# Patient Record
Sex: Male | Born: 1975 | Hispanic: Yes | Marital: Married | Smoking: Former smoker
Health system: Southern US, Community
[De-identification: ages and names within clinical notes are randomized; demographics above are authoritative.]

## PROBLEM LIST (undated history)

## (undated) DIAGNOSIS — E119 Type 2 diabetes mellitus without complications: Secondary | ICD-10-CM

## (undated) HISTORY — PX: LEG SURGERY: SHX1003

---

## 2014-02-01 ENCOUNTER — Emergency Department: Payer: Self-pay | Admitting: Emergency Medicine

## 2014-08-14 ENCOUNTER — Ambulatory Visit: Payer: Self-pay | Admitting: Physician Assistant

## 2014-08-14 LAB — URINALYSIS, COMPLETE
Bilirubin,UR: NEGATIVE
Blood: NEGATIVE
Ketone: 40
Leukocyte Esterase: NEGATIVE
Nitrite: NEGATIVE
PH: 5.5 (ref 5.0–8.0)
Protein: NEGATIVE
Specific Gravity: 1.025 (ref 1.000–1.030)

## 2014-08-14 LAB — COMPREHENSIVE METABOLIC PANEL
ALT: 28 U/L
ANION GAP: 9 (ref 7–16)
AST: 15 U/L (ref 15–37)
Albumin: 3.9 g/dL (ref 3.4–5.0)
Alkaline Phosphatase: 134 U/L — ABNORMAL HIGH
BUN: 17 mg/dL (ref 7–18)
Bilirubin,Total: 0.4 mg/dL (ref 0.2–1.0)
Calcium, Total: 9 mg/dL (ref 8.5–10.1)
Chloride: 99 mmol/L (ref 98–107)
Co2: 28 mmol/L (ref 21–32)
Creatinine: 0.92 mg/dL (ref 0.60–1.30)
EGFR (African American): >60
Glucose: 268 mg/dL — ABNORMAL HIGH (ref 65–99)
Osmolality: 283 (ref 275–301)
POTASSIUM: 3.7 mmol/L (ref 3.5–5.1)
SODIUM: 136 mmol/L (ref 136–145)
Total Protein: 7.6 g/dL (ref 6.4–8.2)

## 2014-08-16 LAB — URINE CULTURE

## 2015-01-05 ENCOUNTER — Ambulatory Visit: Payer: Self-pay | Admitting: Physician Assistant

## 2015-04-11 ENCOUNTER — Encounter (HOSPITAL_BASED_OUTPATIENT_CLINIC_OR_DEPARTMENT_OTHER): Payer: Self-pay | Admitting: *Deleted

## 2015-04-11 ENCOUNTER — Emergency Department (HOSPITAL_BASED_OUTPATIENT_CLINIC_OR_DEPARTMENT_OTHER): Payer: No Typology Code available for payment source

## 2015-04-11 ENCOUNTER — Emergency Department (HOSPITAL_BASED_OUTPATIENT_CLINIC_OR_DEPARTMENT_OTHER)
Admission: EM | Admit: 2015-04-11 | Discharge: 2015-04-11 | Disposition: A | Discharge status: Home / Self Care | Payer: No Typology Code available for payment source | Attending: Emergency Medicine | Admitting: Emergency Medicine

## 2015-04-11 DIAGNOSIS — S161XXA Strain of muscle, fascia and tendon at neck level, initial encounter: Secondary | ICD-10-CM | POA: Insufficient documentation

## 2015-04-11 DIAGNOSIS — M79605 Pain in left leg: Secondary | ICD-10-CM

## 2015-04-11 DIAGNOSIS — Y998 Other external cause status: Secondary | ICD-10-CM | POA: Insufficient documentation

## 2015-04-11 DIAGNOSIS — S39012A Strain of muscle, fascia and tendon of lower back, initial encounter: Secondary | ICD-10-CM | POA: Diagnosis not present

## 2015-04-11 DIAGNOSIS — Y9389 Activity, other specified: Secondary | ICD-10-CM | POA: Insufficient documentation

## 2015-04-11 DIAGNOSIS — S8992XA Unspecified injury of left lower leg, initial encounter: Secondary | ICD-10-CM | POA: Insufficient documentation

## 2015-04-11 DIAGNOSIS — Y92410 Unspecified street and highway as the place of occurrence of the external cause: Secondary | ICD-10-CM | POA: Insufficient documentation

## 2015-04-11 DIAGNOSIS — E119 Type 2 diabetes mellitus without complications: Secondary | ICD-10-CM | POA: Insufficient documentation

## 2015-04-11 DIAGNOSIS — S3992XA Unspecified injury of lower back, initial encounter: Secondary | ICD-10-CM | POA: Diagnosis present

## 2015-04-11 HISTORY — DX: Type 2 diabetes mellitus without complications: E11.9

## 2015-04-11 MED ORDER — HYDROCODONE-ACETAMINOPHEN 5-325 MG PO TABS: 1.0000 | ORAL_TABLET | ORAL | Status: DC | PRN

## 2015-04-11 MED ORDER — HYDROCODONE-ACETAMINOPHEN 5-325 MG PO TABS
1.0000 | ORAL_TABLET | Freq: Once | ORAL | Status: AC
  Administered 2015-04-11: 1 via ORAL
  Filled 2015-04-11: qty 1

## 2015-04-11 MED ORDER — CYCLOBENZAPRINE HCL 5 MG PO TABS: 5.0000 mg | ORAL_TABLET | Freq: Two times a day (BID) | ORAL | Status: DC | PRN

## 2015-04-11 NOTE — ED Notes (Signed)
Pt is able to add weight to his Rt leg, whoever, he is unable to add weight to his Lt leg. Pt states that his Lt calf is hurting and when he adds weight, the pain shots done to his Lt toes. Pt was unable to ambulate for me and RN Fannie KneeSue and NP Deliah BostonVrinda were informed.
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
## 2015-04-11 NOTE — Discharge Instructions (Signed)
Cervical Sprain °A cervical sprain is an injury in the neck in which the strong, fibrous tissues (ligaments) that connect your neck bones stretch or tear. Cervical sprains can range from mild to severe. Severe cervical sprains can cause the neck vertebrae to be unstable. This can lead to damage of the spinal cord and can result in serious nervous system problems. The amount of time it takes for a cervical sprain to get better depends on the cause and extent of the injury. Most cervical sprains heal in 1 to 3 weeks. °CAUSES  °Severe cervical sprains may be caused by:  °· Contact sport injuries (such as from football, rugby, wrestling, hockey, auto racing, gymnastics, diving, martial arts, or boxing).   °· Motor vehicle collisions.   °· Whiplash injuries. This is an injury from a sudden forward and backward whipping movement of the head and neck.  °· Falls.   °Mild cervical sprains may be caused by:  °· Being in an awkward position, such as while cradling a telephone between your ear and shoulder.   °· Sitting in a chair that does not offer proper support.   °· Working at a poorly designed computer station.   °· Looking up or down for long periods of time.   °SYMPTOMS  °· Pain, soreness, stiffness, or a burning sensation in the front, back, or sides of the neck. This discomfort may develop immediately after the injury or slowly, 24 hours or more after the injury.   °· Pain or tenderness directly in the middle of the back of the neck.   °· Shoulder or upper back pain.   °· Limited ability to move the neck.   °· Headache.   °· Dizziness.   °· Weakness, numbness, or tingling in the hands or arms.   °· Muscle spasms.   °· Difficulty swallowing or chewing.   °· Tenderness and swelling of the neck.   °DIAGNOSIS  °Most of the time your health care provider can diagnose a cervical sprain by taking your history and doing a physical exam. Your health care provider will ask about previous neck injuries and any known neck  problems, such as arthritis in the neck. X-rays may be taken to find out if there are any other problems, such as with the bones of the neck. Other tests, such as a CT scan or MRI, may also be needed.  °TREATMENT  °Treatment depends on the severity of the cervical sprain. Mild sprains can be treated with rest, keeping the neck in place (immobilization), and pain medicines. Severe cervical sprains are immediately immobilized. Further treatment is done to help with pain, muscle spasms, and other symptoms and may include: °· Medicines, such as pain relievers, numbing medicines, or muscle relaxants.   °· Physical therapy. This may involve stretching exercises, strengthening exercises, and posture training. Exercises and improved posture can help stabilize the neck, strengthen muscles, and help stop symptoms from returning.   °HOME CARE INSTRUCTIONS  °· Put ice on the injured area.   °¨ Put ice in a plastic bag.   °¨ Place a towel between your skin and the bag.   °¨ Leave the ice on for 15-20 minutes, 3-4 times a day.   °· If your injury was severe, you may have been given a cervical collar to wear. A cervical collar is a two-piece collar designed to keep your neck from moving while it heals. °¨ Do not remove the collar unless instructed by your health care provider. °¨ If you have long hair, keep it outside of the collar. °¨ Ask your health care provider before making any adjustments to your collar. Minor   adjustments may be required over time to improve comfort and reduce pressure on your chin or on the back of your head. °¨ If you are allowed to remove the collar for cleaning or bathing, follow your health care provider's instructions on how to do so safely. °¨ Keep your collar clean by wiping it with mild soap and water and drying it completely. If the collar you have been given includes removable pads, remove them every 1-2 days and hand wash them with soap and water. Allow them to air dry. They should be completely  dry before you wear them in the collar. °¨ If you are allowed to remove the collar for cleaning and bathing, wash and dry the skin of your neck. Check your skin for irritation or sores. If you see any, tell your health care provider. °¨ Do not drive while wearing the collar.   °· Only take over-the-counter or prescription medicines for pain, discomfort, or fever as directed by your health care provider.   °· Keep all follow-up appointments as directed by your health care provider.   °· Keep all physical therapy appointments as directed by your health care provider.   °· Make any needed adjustments to your workstation to promote good posture.   °· Avoid positions and activities that make your symptoms worse.   °· Warm up and stretch before being active to help prevent problems.   °SEEK MEDICAL CARE IF:  °· Your pain is not controlled with medicine.   °· You are unable to decrease your pain medicine over time as planned.   °· Your activity level is not improving as expected.   °SEEK IMMEDIATE MEDICAL CARE IF:  °· You develop any bleeding. °· You develop stomach upset. °· You have signs of an allergic reaction to your medicine.   °· Your symptoms get worse.   °· You develop new, unexplained symptoms.   °· You have numbness, tingling, weakness, or paralysis in any part of your body.   °MAKE SURE YOU:  °· Understand these instructions. °· Will watch your condition. °· Will get help right away if you are not doing well or get worse. °Document Released: 08/23/2007 Document Revised: 10/31/2013 Document Reviewed: 05/03/2013 °Exit[employer]® Patient Information ©2015 [employer]. This information is not intended to replace advice given to you by your health care provider. Make sure you discuss any questions you have with your health care provider. °Motor Vehicle Collision °After a car crash (motor vehicle collision), it is normal to have bruises and sore muscles. The first 24 hours usually feel the worst. After that, you will  likely start to feel better each day. °HOME CARE °· Put ice on the injured area. °¨ Put ice in a plastic bag. °¨ Place a towel between your skin and the bag. °¨ Leave the ice on for 15-20 minutes, 03-04 times a day. °· Drink enough fluids to keep your pee (urine) clear or pale yellow. °· Do not drink alcohol. °· Take a warm shower or bath 1 or 2 times a day. This helps your sore muscles. °· Return to activities as told by your doctor. Be careful when lifting. Lifting can make neck or back pain worse. °· Only take medicine as told by your doctor. Do not use aspirin. °GET HELP RIGHT AWAY IF:  °· Your arms or legs tingle, feel weak, or lose feeling (numbness). °· You have headaches that do not get better with medicine. °· You have neck pain, especially in the middle of the back of your neck. °· You cannot control when you pee (urinate) or   poop (bowel movement). °· Pain is getting worse in any part of your body. °· You are short of breath, dizzy, or pass out (faint). °· You have chest pain. °· You feel sick to your stomach (nauseous), throw up (vomit), or sweat. °· You have belly (abdominal) pain that gets worse. °· There is blood in your pee, poop, or throw up. °· You have pain in your shoulder (shoulder strap areas). °· Your problems are getting worse. °MAKE SURE YOU:  °· Understand these instructions. °· Will watch your condition. °· Will get help right away if you are not doing well or get worse. °Document Released: 04/13/2008 Document Revised: 01/18/2012 Document Reviewed: 03/25/2011 °ExitCare® Patient Information ©2015 [employer]. This information is not intended to replace advice given to you by your health care provider. Make sure you discuss any questions you have with your health care provider. ° °
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
## 2015-04-15 ENCOUNTER — Emergency Department
Admission: EM | Admit: 2015-04-15 | Discharge: 2015-04-15 | Disposition: A | Discharge status: Home / Self Care | Payer: No Typology Code available for payment source | Attending: Emergency Medicine | Admitting: Emergency Medicine

## 2015-04-15 ENCOUNTER — Emergency Department: Payer: No Typology Code available for payment source

## 2015-04-15 ENCOUNTER — Encounter: Payer: Self-pay | Admitting: Emergency Medicine

## 2015-04-15 DIAGNOSIS — S0990XD Unspecified injury of head, subsequent encounter: Secondary | ICD-10-CM

## 2015-04-15 DIAGNOSIS — Z79899 Other long term (current) drug therapy: Secondary | ICD-10-CM | POA: Insufficient documentation

## 2015-04-15 DIAGNOSIS — Z87891 Personal history of nicotine dependence: Secondary | ICD-10-CM | POA: Diagnosis not present

## 2015-04-15 DIAGNOSIS — E119 Type 2 diabetes mellitus without complications: Secondary | ICD-10-CM | POA: Diagnosis not present

## 2015-04-15 DIAGNOSIS — S8012XD Contusion of left lower leg, subsequent encounter: Secondary | ICD-10-CM | POA: Diagnosis not present

## 2015-04-15 DIAGNOSIS — S8992XD Unspecified injury of left lower leg, subsequent encounter: Secondary | ICD-10-CM | POA: Diagnosis present

## 2015-04-15 MED ORDER — KETOROLAC TROMETHAMINE 60 MG/2ML IM SOLN
60.0000 mg | Freq: Once | INTRAMUSCULAR | Status: AC
  Administered 2015-04-15: 60 mg via INTRAMUSCULAR

## 2015-04-15 MED ORDER — KETOROLAC TROMETHAMINE 60 MG/2ML IM SOLN
INTRAMUSCULAR | Status: AC
Start: 2015-04-15 — End: 2015-04-15
  Filled 2015-04-15: qty 2

## 2015-04-15 MED ORDER — ORPHENADRINE CITRATE 30 MG/ML IJ SOLN
60.0000 mg | Freq: Two times a day (BID) | INTRAMUSCULAR | Status: DC
  Administered 2015-04-15: 60 mg via INTRAMUSCULAR

## 2015-04-15 MED ORDER — IBUPROFEN 800 MG PO TABS
800.0000 mg | ORAL_TABLET | Freq: Three times a day (TID) | ORAL | Status: DC | PRN
Start: 2015-04-15 — End: 2016-04-27

## 2015-04-15 MED ORDER — ORPHENADRINE CITRATE 30 MG/ML IJ SOLN
INTRAMUSCULAR | Status: AC
  Filled 2015-04-15: qty 2
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
## 2015-04-15 NOTE — ED Notes (Signed)
2 days ago, pain L leg and back

## 2015-04-15 NOTE — Discharge Instructions (Signed)
Take medication as prescribed. Alternate heat and ice. Rest. Avoid strenuous activity.  Follow up with your primary care physician this week.  Return to the ER for new or worsening concerns.  Contusion A contusion is a deep bruise. Contusions happen when an injury causes bleeding under the skin. Signs of bruising include pain, puffiness (swelling), and discolored skin. The contusion may turn blue, purple, or yellow. HOME CARE   Put ice on the injured area.  Put ice in a plastic bag.  Place a towel between your skin and the bag.  Leave the ice on for 15-20 minutes, 03-04 times a day.  Only take medicine as told by your doctor.  Rest the injured area.  If possible, raise (elevate) the injured area to lessen puffiness. GET HELP RIGHT AWAY IF:   You have more bruising or puffiness.  You have pain that is getting worse.  Your puffiness or pain is not helped by medicine. MAKE SURE YOU:   Understand these instructions.  Will watch your condition.  Will get help right away if you are not doing well or get worse. Document Released: 04/13/2008 Document Revised: 01/18/2012 Document Reviewed: 08/31/2011 [facility] Patient Information 2015 [place], [place]. This information is not intended to replace advice given to you by your health care provider. Make sure you discuss any questions you have with your health care provider.  Head Injury You have received a head injury. It does not appear serious at this time. Headaches and vomiting are common following head injury. It should be easy to awaken from sleeping. Sometimes it is necessary for you to stay in the emergency department for a while for observation. Sometimes admission to the hospital may be needed. After injuries such as yours, most problems occur within the first 24 hours, but side effects may occur up to 7-10 days after the injury. It is important for you to carefully monitor your condition and contact your health care provider or  seek immediate medical care if there is a change in your condition. WHAT ARE THE TYPES OF HEAD INJURIES? Head injuries can be as minor as a bump. Some head injuries can be more severe. More severe head injuries include:  A jarring injury to the brain (concussion).  A bruise of the brain (contusion). This mean there is bleeding in the brain that can cause swelling.  A cracked skull (skull fracture).  Bleeding in the brain that collects, clots, and forms a bump (hematoma). WHAT CAUSES A HEAD INJURY? A serious head injury is most likely to happen to someone who is in a car wreck and is not wearing a seat belt. Other causes of major head injuries include bicycle or motorcycle accidents, sports injuries, and falls. HOW ARE HEAD INJURIES DIAGNOSED? A complete history of the event leading to the injury and your current symptoms will be helpful in diagnosing head injuries. Many times, pictures of the brain, such as CT or MRI are needed to see the extent of the injury. Often, an overnight hospital stay is necessary for observation.  WHEN SHOULD I SEEK IMMEDIATE MEDICAL CARE?  You should get help right away if:  You have confusion or drowsiness.  You feel sick to your stomach (nauseous) or have continued, forceful vomiting.  You have dizziness or unsteadiness that is getting worse.  You have severe, continued headaches not relieved by medicine. Only take over-the-counter or prescription medicines for pain, fever, or discomfort as directed by your health care provider.  You do not have normal  function of the arms or legs or are unable to walk.  You notice changes in the black spots in the center of the colored part of your eye (pupil).  You have a clear or bloody fluid coming from your nose or ears.  You have a loss of vision. During the next 24 hours after the injury, you must stay with someone who can watch you for the warning signs. This person should contact local emergency services (911 in  the [place] if you have seizures, you become unconscious, or you are unable to wake up. HOW CAN I PREVENT A HEAD INJURY IN THE FUTURE? The most important factor for preventing major head injuries is avoiding motor vehicle accidents. To minimize the potential for damage to your head, it is crucial to wear seat belts while riding in motor vehicles. Wearing helmets while bike riding and playing collision sports (like football) is also helpful. Also, avoiding dangerous activities around the house will further help reduce your risk of head injury.  WHEN CAN I RETURN TO NORMAL ACTIVITIES AND ATHLETICS? You should be reevaluated by your health care provider before returning to these activities. If you have any of the following symptoms, you should not return to activities or contact sports until 1 week after the symptoms have stopped:  Persistent headache.  Dizziness or vertigo.  Poor attention and concentration.  Confusion.  Memory problems.  Nausea or vomiting.  Fatigue or tire easily.  Irritability.  Intolerant of bright lights or loud noises.  Anxiety or depression.  Disturbed sleep. MAKE SURE YOU:   Understand these instructions.  Will watch your condition.  Will get help right away if you are not doing well or get worse. Document Released: 10/26/2005 Document Revised: 10/31/2013 Document Reviewed: 07/03/2013 [facility] Patient Information 2015 [place], [place]. This information is not intended to replace advice given to you by your health care provider. Make sure you discuss any questions you have with your health care provider.
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
## 2015-07-10 ENCOUNTER — Emergency Department
Admission: EM | Admit: 2015-07-10 | Discharge: 2015-07-10 | Disposition: A | Discharge status: Home / Self Care | Payer: Self-pay | Attending: Emergency Medicine | Admitting: Emergency Medicine

## 2015-07-10 ENCOUNTER — Encounter: Payer: Self-pay | Admitting: Student

## 2015-07-10 DIAGNOSIS — E119 Type 2 diabetes mellitus without complications: Secondary | ICD-10-CM | POA: Insufficient documentation

## 2015-07-10 DIAGNOSIS — A084 Viral intestinal infection, unspecified: Secondary | ICD-10-CM | POA: Insufficient documentation

## 2015-07-10 DIAGNOSIS — Z87891 Personal history of nicotine dependence: Secondary | ICD-10-CM | POA: Insufficient documentation

## 2015-07-10 LAB — GLUCOSE, CAPILLARY: GLUCOSE-CAPILLARY: 129 mg/dL — AB (ref 65–99)

## 2015-07-10 MED ORDER — ONDANSETRON 4 MG PO TBDP
4.0000 mg | ORAL_TABLET | Freq: Once | ORAL | Status: AC
  Administered 2015-07-10: 4 mg via ORAL
  Filled 2015-07-10: qty 1

## 2015-07-10 MED ORDER — ONDANSETRON 4 MG PO TBDP: 4.0000 mg | ORAL_TABLET | Freq: Three times a day (TID) | ORAL | Status: DC | PRN

## 2015-07-10 NOTE — ED Notes (Signed)
Pt reports n/v/d since this morning. States he took his morning meds and has been sick ever since. Denies eating anything this morning. States he normally eats and then takes his meds but he did not feel like eating. Sates he "feels like he has a fever" and that he has felt dizzy. Pt alert & oriented with NAD noted.

## 2015-07-10 NOTE — ED Provider Notes (Signed)
[facility] Emergency Department Provider Note ____________________________________________  Time seen: Approximately 11:58 AM  I have reviewed the triage vital signs and the nursing notes.   HISTORY  Chief Complaint Nausea and Emesis   HPI Roberto Alesi Garza is a 39 y.o. male who presents to the emergency department for evaluation of nausea and diarrhea. He states that he took his medications on an empty stomach, which she does not typically do. He states that he just did not have an appetite this morning and felt a little dizzy and weak. He reports 2 episodes of vomiting and 3 episodes of diarrhea today. He complains of feeling hot and cold. He denies abdominal pain.   Past Medical History  Diagnosis Date  . Diabetes mellitus without complication     There are no active problems to display for this patient.   Past Surgical History  Procedure Laterality Date  . Leg surgery      Current Outpatient Rx  Name  Route  Sig  Dispense  Refill  . cyclobenzaprine (FLEXERIL) 5 MG tablet   Oral   Take 1 tablet (5 mg total) by mouth 2 (two) times daily as needed.   20 tablet   0   . HYDROcodone-acetaminophen (NORCO/VICODIN) 5-325 MG per tablet   Oral   Take 1-2 tablets by mouth every 4 (four) hours as needed.   10 tablet   0   . ibuprofen (ADVIL,MOTRIN) 800 MG tablet   Oral   Take 1 tablet (800 mg total) by mouth every 8 (eight) hours as needed for mild pain or moderate pain.   15 tablet   0   . METFORMIN HCL PO   Oral   Take by mouth.         . ondansetron (ZOFRAN-ODT) 4 MG disintegrating tablet   Oral   Take 1 tablet (4 mg total) by mouth every 8 (eight) hours as needed for nausea or vomiting.   20 tablet   0     Allergies Review of patient's allergies indicates no known allergies.  History reviewed. No pertinent family history.  Social History Social History  Substance Use Topics  . Smoking status: Former Games developer  . Smokeless  tobacco: None  . Alcohol Use: Yes     Comment: occasional    Review of Systems Constitutional: No fever/ positive forchills Eyes: No visual changes. ENT: No sore throat. Cardiovascular: Denies chest pain. Respiratory: Denies shortness of breath. Gastrointestinal: No abdominal pain.  Positive for nausea, positive for vomiting.  Positive for diarrhea.  No constipation. Genitourinary: Negative for dysuria. Musculoskeletal: Negative for back pain. Skin: Negative for rash. Neurological: Negative for headaches, focal weakness or numbness.  10-point ROS otherwise negative.  ____________________________________________   PHYSICAL EXAM:  VITAL SIGNS: ED Triage Vitals  Enc Vitals Group     BP 07/10/15 1039 128/94 mmHg     Pulse Rate 07/10/15 1039 85     Resp 07/10/15 1039 18     Temp 07/10/15 1039 98.1 F (36.7 C)     Temp Source 07/10/15 1039 Oral     SpO2 07/10/15 1039 98 %     Weight 07/10/15 1039 230 lb (104.327 kg)     Height 07/10/15 1039 5\' 7"  (1.702 m)     Head Cir --      Peak Flow --      Pain Score 07/10/15 1040 3     Pain Loc --      Pain Edu? --  Excl. in GC? --     Constitutional: Alert and oriented. Well appearing and in no acute distress. Eyes: Conjunctivae are normal. PERRL. EOMI. Head: Atraumatic. Nose: No congestion/rhinnorhea. Mouth/Throat: Mucous membranes are moist.  Oropharynx non-erythematous. Neck: No stridor.   Cardiovascular: Normal rate, regular rhythm. Grossly normal heart sounds.  Good peripheral circulation. Respiratory: Normal respiratory effort.  No retractions. Lungs CTAB. Gastrointestinal: Soft and nontender. No distention. No abdominal bruits. No CVA tenderness. Musculoskeletal: No lower extremity tenderness nor edema.  No joint effusions. Neurologic:  Normal speech and language. No gross focal neurologic deficits are appreciated. No gait instability. Skin:  Skin is warm, dry and intact. No rash noted. Psychiatric: Mood and affect  are normal. Speech and behavior are normal.  ____________________________________________   LABS (all labs ordered are listed, but only abnormal results are displayed)  Labs Reviewed  GLUCOSE, CAPILLARY - Abnormal; Notable for the following:    Glucose-Capillary 129 (*)    All other components within normal limits   ____________________________________________  EKG   ____________________________________________  RADIOLOGY  Not indicated ____________________________________________   PROCEDURES  Procedure(s) performed: None  Critical Care performed: No  ____________________________________________   INITIAL IMPRESSION / ASSESSMENT AND PLAN / ED COURSE  Pertinent labs & imaging results that were available during my care of the patient were reviewed by me and considered in my medical decision making (see chart for details).  Patient was given sublingual Zofran while in the emergency department with relief of nausea. He will be prescribed the same and discharged home. He's to follow-up with his primary care provider or return to emergency department for symptoms change or worsen. ____________________________________________   FINAL CLINICAL IMPRESSION(S) / ED DIAGNOSES  Final diagnoses:  Viral gastroenteritis      Chinita Pester, FNP 07/10/15 1251  Arnaldo Natal, MD 07/10/15 [phone]

## 2015-07-10 NOTE — ED Notes (Signed)
Pt states began to feel nauseated at work this morning, reports vomiting a small amount and saw specks of blood in the emesis. States he felt dizzy and "feverish".  Pt reports he is a diabetic and takes oral medications.

## 2015-07-10 NOTE — ED Notes (Signed)
rx given to pt. Pt understands discharge instructions

## 2016-04-06 ENCOUNTER — Ambulatory Visit
Admission: EM | Admit: 2016-04-06 | Discharge: 2016-04-06 | Disposition: A | Discharge status: Home / Self Care | Payer: BLUE CROSS/BLUE SHIELD | Attending: Family Medicine | Admitting: Family Medicine

## 2016-04-06 ENCOUNTER — Encounter: Payer: Self-pay | Admitting: Emergency Medicine

## 2016-04-06 DIAGNOSIS — M7712 Lateral epicondylitis, left elbow: Secondary | ICD-10-CM | POA: Diagnosis not present

## 2016-04-06 MED ORDER — HYDROCODONE-ACETAMINOPHEN 5-325 MG PO TABS: 1.0000 | ORAL_TABLET | Freq: Four times a day (QID) | ORAL | Status: DC | PRN

## 2016-04-06 NOTE — ED Notes (Signed)
Pt reports left arm pain for about a week, lower arm. Pt had injury to left arm about a week ago, hit it hard against a metal bar on scaffold. 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 Concerned for fracture or other injury.

## 2016-04-06 NOTE — Discharge Instructions (Signed)
Tennis Elbow  Tennis elbow (lateral epicondylitis) is inflammation of the outer tendons of your forearm close to your elbow. Your tendons attach your muscles to your bones. The outer tendons of your forearm are used to extend your wrist, and they attach on the outside part of your elbow. Tennis elbow is often found in people who play tennis, but anyone may get the condition from repeatedly extending the wrist or turning the forearm.  CAUSES  This condition is caused by repeatedly extending your wrist and using your hands. It can result from sports or work that requires repetitive forearm movements. Tennis elbow may also be caused by an injury.  RISK FACTORS  You have a higher risk of developing tennis elbow if you play tennis or another racquet sport. You also have a higher risk if you frequently use your hands for work. This condition is also more likely to develop in:  · Musicians.  · Carpenters, painters, and plumbers.  · Cooks.  · Cashiers.  · People who work in factories.  · Construction workers.  · Butchers.  · People who use computers.  SYMPTOMS  Symptoms of this condition include:  · Pain and tenderness in your forearm and the outer part of your elbow. You may only feel the pain when you use your arm, or you may feel it even when you are not using your arm.  · A burning feeling that runs from your elbow through your arm.  · Weak grip in your hands.  DIAGNOSIS   This condition may be diagnosed by medical history and physical exam. You may also have other tests, including:  · X-rays.  · MRI.  TREATMENT  Your health care provider will recommend lifestyle adjustments, such as resting and icing your arm. Treatment may also include:  · Medicines for inflammation. This may include shots of cortisone if your pain continues.  · Physical therapy. This may include massage or exercises.  · An elbow brace.  Surgery may eventually be recommended if your pain does not go away with treatment.  HOME CARE  INSTRUCTIONS  Activity  · Rest your elbow and wrist as directed by your health care provider. Try to avoid any activities that caused the problem until your health care provider says that you can do them again.  · If a physical therapist teaches you exercises, do all of them as directed.  · If you lift an object, lift it with your palm facing upward. This lowers the stress on your elbow.  Lifestyle  · If your tennis elbow is caused by sports, check your equipment and make sure that:    You are using it correctly.    It is the best fit for you.  · If your tennis elbow is caused by work, take breaks frequently, if you are able. Talk with your manager about how to best perform tasks in a way that is safe.    If your tennis elbow is caused by computer use, talk with your manager about any changes that can be made to your work environment.  General Instructions  · If directed, apply ice to the painful area:    Put ice in a plastic bag.    Place a towel between your skin and the bag.    Leave the ice on for 20 minutes, 2-3 times per day.  · Take medicines only as directed by your health care provider.  · If you were given a brace, wear it as directed   by your health care provider.  · Keep all follow-up visits as directed by your health care provider. This is important.  SEEK MEDICAL CARE IF:  · Your pain does not get better with treatment.  · Your pain gets worse.  · You have numbness or weakness in your forearm, hand, or fingers.     This information is not intended to replace advice given to you by your health care provider. Make sure you discuss any questions you have with your health care provider.     Document Released: 10/26/2005 Document Revised: 03/12/2015 Document Reviewed: 10/22/2014  Elsevier Interactive Patient Education ©2016 [employer]      Lateral Epicondylitis With Rehab  Lateral epicondylitis involves inflammation and pain around the outer portion of the elbow. The pain is caused by inflammation of the  tendons in the forearm that bring back (extend) the wrist. Lateral epicondylitis is also called tennis elbow, because it is very common in tennis players. However, it may occur in any individual who extends the wrist repetitively. If lateral epicondylitis is left untreated, it may become a chronic problem.  SYMPTOMS   · Pain, tenderness, and inflammation on the outer (lateral) side of the elbow.  · Pain or weakness with gripping activities.  · Pain that increases with wrist-twisting motions (playing tennis, using a screwdriver, opening a door or a jar).  · Pain with lifting objects, including a coffee cup.  CAUSES   Lateral epicondylitis is caused by inflammation of the tendons that extend the wrist. Causes of injury may include:  · Repetitive stress and strain on the muscles and tendons that extend the wrist.  · Sudden change in activity level or intensity.  · Incorrect grip in racquet sports.  · Incorrect grip size of racquet (often too large).  · Incorrect hitting position or technique (usually backhand, leading with the elbow).  · Using a racket that is too heavy.  RISK INCREASES WITH:  · Sports or occupations that require repetitive and/or strenuous forearm and wrist movements (tennis, squash, racquetball, carpentry).  · Poor wrist and forearm strength and flexibility.  · Failure to warm up properly before activity.  · Resuming activity before healing, rehabilitation, and conditioning are complete.  PREVENTION   · Warm up and stretch properly before activity.  · Maintain physical fitness:    Strength, flexibility, and endurance.    Cardiovascular fitness.  · Wear and use properly fitted equipment.  · Learn and use proper technique and have a coach correct improper technique.  · Wear a tennis elbow (counterforce) brace.  PROGNOSIS   The course of this condition depends on the degree of the injury. If treated properly, acute cases (symptoms lasting less than 4 weeks) are often resolved in 2 to 6 weeks. Chronic  (longer lasting cases) often resolve in 3 to 6 months but may require physical therapy.  RELATED COMPLICATIONS   · Frequently recurring symptoms, resulting in a chronic problem. Properly treating the problem the first time decreases frequency of recurrence.  · Chronic inflammation, scarring tendon degeneration, and partial tendon tear, requiring surgery.  · Delayed healing or resolution of symptoms.  TREATMENT   Treatment first involves the use of ice and medicine to reduce pain and inflammation. Strengthening and stretching exercises may help reduce discomfort if performed regularly. These exercises may be performed at home if the condition is an acute injury. Chronic cases may require a referral to a physical therapist for evaluation and treatment. Your caregiver may advise a corticosteroid   injection to help reduce inflammation. Rarely, surgery is needed.  MEDICATION  · If pain medicine is needed, nonsteroidal anti-inflammatory medicines (aspirin and ibuprofen), or other minor pain relievers (acetaminophen), are often advised.  · Do not take pain medicine for 7 days before surgery.  · Prescription pain relievers may be given, if your caregiver thinks they are needed. Use only as directed and only as much as you need.  · Corticosteroid injections may be recommended. These injections should be reserved only for the most severe cases, because they can only be given a certain number of times.  HEAT AND COLD  · Cold treatment (icing) should be applied for 10 to 15 minutes every 2 to 3 hours for inflammation and pain, and immediately after activity that aggravates your symptoms. Use ice packs or an ice massage.  · Heat treatment may be used before performing stretching and strengthening activities prescribed by your caregiver, physical therapist, or athletic trainer. Use a heat pack or a warm water soak.  SEEK MEDICAL CARE IF:  Symptoms get worse or do not improve in 2 weeks, despite treatment.  EXERCISES   RANGE OF  MOTION (ROM) AND STRETCHING EXERCISES - Epicondylitis, Lateral (Tennis Elbow)  These exercises may help you when beginning to rehabilitate your injury. Your symptoms may go away with or without further involvement from your physician, physical therapist, or athletic trainer. While completing these exercises, remember:   · Restoring tissue flexibility helps normal motion to return to the joints. This allows healthier, less painful movement and activity.  · An effective stretch should be held for at least 30 seconds.  · A stretch should never be painful. You should only feel a gentle lengthening or release in the stretched tissue.  RANGE OF MOTION - Wrist Flexion, Active-Assisted  · Extend your right / left elbow with your fingers pointing down.*  · Gently pull the back of your hand towards you, until you feel a gentle stretch on the top of your forearm.  · Hold this position for __________ seconds.  Repeat __________ times. Complete this exercise __________ times per day.   *If directed by your physician, physical therapist or athletic trainer, complete this stretch with your elbow bent, rather than extended.  RANGE OF MOTION - Wrist Extension, Active-Assisted  · Extend your right / left elbow and turn your palm upwards.*  · Gently pull your palm and fingertips back, so your wrist extends and your fingers point more toward the ground.  · You should feel a gentle stretch on the inside of your forearm.  · Hold this position for __________ seconds.  Repeat __________ times. Complete this exercise __________ times per day.  *If directed by your physician, physical therapist or athletic trainer, complete this stretch with your elbow bent, rather than extended.  STRETCH - Wrist Flexion  · Place the back of your right / left hand on a tabletop, leaving your elbow slightly bent. Your fingers should point away from your body.  · Gently press the back of your hand down onto the table by straightening your elbow. You should  feel a stretch on the top of your forearm.  · Hold this position for __________ seconds.  Repeat __________ times. Complete this stretch __________ times per day.   STRETCH - Wrist Extension   · Place your right / left fingertips on a tabletop, leaving your elbow slightly bent. Your fingers should point backwards.  · Gently press your fingers and palm down onto the table by   straightening your elbow. You should feel a stretch on the inside of your forearm.  · Hold this position for __________ seconds.  Repeat __________ times. Complete this stretch __________ times per day.   STRENGTHENING EXERCISES - Epicondylitis, Lateral (Tennis Elbow)  These exercises may help you when beginning to rehabilitate your injury. They may resolve your symptoms with or without further involvement from your physician, physical therapist, or athletic trainer. While completing these exercises, remember:   · Muscles can gain both the endurance and the strength needed for everyday activities through controlled exercises.  · Complete these exercises as instructed by your physician, physical therapist or athletic trainer. Increase the resistance and repetitions only as guided.  · You may experience muscle soreness or fatigue, but the pain or discomfort you are trying to eliminate should never worsen during these exercises. If this pain does get worse, stop and make sure you are following the directions exactly. If the pain is still present after adjustments, discontinue the exercise until you can discuss the trouble with your caregiver.  STRENGTH - Wrist Flexors  · Sit with your right / left forearm palm-up and fully supported on a table or countertop. Your elbow should be resting below the height of your shoulder. Allow your wrist to extend over the edge of the surface.  · Loosely holding a __________ weight, or a piece of rubber exercise band or tubing, slowly curl your hand up toward your forearm.  · Hold this position for __________  seconds. Slowly lower the wrist back to the starting position in a controlled manner.  Repeat __________ times. Complete this exercise __________ times per day.   STRENGTH - Wrist Extensors  · Sit with your right / left forearm palm-down and fully supported on a table or countertop. Your elbow should be resting below the height of your shoulder. Allow your wrist to extend over the edge of the surface.  · Loosely holding a __________ weight, or a piece of rubber exercise band or tubing, slowly curl your hand up toward your forearm.  · Hold this position for __________ seconds. Slowly lower the wrist back to the starting position in a controlled manner.  Repeat __________ times. Complete this exercise __________ times per day.   STRENGTH - Ulnar Deviators  · Stand with a ____________________ weight in your right / left hand, or sit while holding a rubber exercise band or tubing, with your healthy arm supported on a table or countertop.  · Move your wrist, so that your pinkie travels toward your forearm and your thumb moves away from your forearm.  · Hold this position for __________ seconds and then slowly lower the wrist back to the starting position.  Repeat __________ times. Complete this exercise __________ times per day  STRENGTH - Radial Deviators  · Stand with a ____________________ weight in your right / left hand, or sit while holding a rubber exercise band or tubing, with your injured arm supported on a table or countertop.  · Raise your hand upward in front of you or pull up on the rubber tubing.  · Hold this position for __________ seconds and then slowly lower the wrist back to the starting position.  Repeat __________ times. Complete this exercise __________ times per day.  STRENGTH - Forearm Supinators   · Sit with your right / left forearm supported on a table, keeping your elbow below shoulder height. Rest your hand over the edge, palm down.  · Gently grip a hammer or a   soup ladle.  · Without moving  your elbow, slowly turn your palm and hand upward to a "thumbs-up" position.  · Hold this position for __________ seconds. Slowly return to the starting position.  Repeat __________ times. Complete this exercise __________ times per day.   STRENGTH - Forearm Pronators   · Sit with your right / left forearm supported on a table, keeping your elbow below shoulder height. Rest your hand over the edge, palm up.  · Gently grip a hammer or a soup ladle.  · Without moving your elbow, slowly turn your palm and hand upward to a "thumbs-up" position.  · Hold this position for __________ seconds. Slowly return to the starting position.  Repeat __________ times. Complete this exercise __________ times per day.   STRENGTH - Grip  · Grasp a tennis ball, a dense sponge, or a large, rolled sock in your hand.  · Squeeze as hard as you can, without increasing any pain.  · Hold this position for __________ seconds. Release your grip slowly.  Repeat __________ times. Complete this exercise __________ times per day.   STRENGTH - Elbow Extensors, Isometric  · Stand or sit upright, on a firm surface. Place your right / left arm so that your palm faces your stomach, and it is at the height of your waist.  · Place your opposite hand on the underside of your forearm. Gently push up as your right / left arm resists. Push as hard as you can with both arms, without causing any pain or movement at your right / left elbow. Hold this stationary position for __________ seconds.  Gradually release the tension in both arms. Allow your muscles to relax completely before repeating.     This information is not intended to replace advice given to you by your health care provider. Make sure you discuss any questions you have with your health care provider.     Document Released: 10/26/2005 Document Revised: 11/16/2014 Document Reviewed: 02/07/2009  Elsevier Interactive Patient Education ©2016 [employer]

## 2016-04-27 ENCOUNTER — Emergency Department
Admission: EM | Admit: 2016-04-27 | Discharge: 2016-04-27 | Disposition: A | Discharge status: Home / Self Care | Payer: BLUE CROSS/BLUE SHIELD | Attending: Emergency Medicine | Admitting: Emergency Medicine

## 2016-04-27 ENCOUNTER — Emergency Department: Payer: BLUE CROSS/BLUE SHIELD

## 2016-04-27 DIAGNOSIS — Z87891 Personal history of nicotine dependence: Secondary | ICD-10-CM | POA: Insufficient documentation

## 2016-04-27 DIAGNOSIS — R05 Cough: Secondary | ICD-10-CM | POA: Diagnosis present

## 2016-04-27 DIAGNOSIS — Z7984 Long term (current) use of oral hypoglycemic drugs: Secondary | ICD-10-CM | POA: Insufficient documentation

## 2016-04-27 DIAGNOSIS — E119 Type 2 diabetes mellitus without complications: Secondary | ICD-10-CM | POA: Insufficient documentation

## 2016-04-27 DIAGNOSIS — J069 Acute upper respiratory infection, unspecified: Secondary | ICD-10-CM | POA: Diagnosis not present

## 2016-04-27 MED ORDER — PSEUDOEPH-BROMPHEN-DM 30-2-10 MG/5ML PO SYRP: 10.0000 mL | ORAL_SOLUTION | Freq: Four times a day (QID) | ORAL | Status: DC | PRN

## 2016-04-27 MED ORDER — FLUTICASONE PROPIONATE 50 MCG/ACT NA SUSP: 2.0000 | Freq: Every day | NASAL | Status: DC

## 2016-04-27 NOTE — ED Provider Notes (Signed)
[facility] Emergency Department Provider Note  ____________________________________________  Time seen: Approximately 7:52 AM  I have reviewed the triage vital signs and the nursing notes.   HISTORY  Chief Complaint Cough    HPI Roberto Garza is a 40 y.o. male , NAD, presents to the emergency department with 1 week history of cough and chest congestion. States he had cold began approximately a week and half ago. Felt that it was getting better but has had increasing cough and mild chest congestion over the last few days. Has taken over-the-counter cough and cold remedies off and on through this time. Denies fever, chills, body aches. Has not had any sinus pressure, ear pain, nasal congestion. Has had a mild scratchy sore throat. Denies chest pain, back pain, abdominal pain, nausea, vomiting, diarrhea. States that his sugars have been running normal at home and he has been taking his diabetic medications daily as prescribed.   Past Medical History  Diagnosis Date  . Diabetes mellitus without complication (HCC)     There are no active problems to display for this patient.   Past Surgical History  Procedure Laterality Date  . Leg surgery      Current Outpatient Rx  Name  Route  Sig  Dispense  Refill  . brompheniramine-pseudoephedrine-DM 30-2-10 MG/5ML syrup   Oral   Take 10 mLs by mouth 4 (four) times daily as needed.   200 mL   0   . fluticasone (FLONASE) 50 MCG/ACT nasal spray   Each Nare   Place 2 sprays into both nostrils daily.   16 g   0   . METFORMIN HCL PO   Oral   Take by mouth.           Allergies Review of patient's allergies indicates no known allergies.  No family history on file.  Social History Social History  Substance Use Topics  . Smoking status: Former Games developer  . Smokeless tobacco: None  . Alcohol Use: Yes     Comment: occasional     Review of Systems  Constitutional: No fever/chills Eyes: No visual  changes. No discharge ENT: Positive scratchy sore throat. No sinus pressure, ear pain, nasal congestion, runny nose, sneezing. Cardiovascular: No chest pain. Respiratory: Positive chest congestion, cough. No shortness of breath. No wheezing.  Gastrointestinal: No abdominal pain.  No nausea, vomiting.  No diarrhea.   Musculoskeletal: Negative for back pain, general myalgias.  Skin: Negative for rash. Neurological: Negative for headaches, focal weakness or numbness. 10-point ROS otherwise negative.  ____________________________________________   PHYSICAL EXAM:  VITAL SIGNS: ED Triage Vitals  Enc Vitals Group     BP 04/27/16 0745 132/83 mmHg     Pulse Rate 04/27/16 0745 66     Resp 04/27/16 0745 18     Temp 04/27/16 0745 98.2 F (36.8 C)     Temp Source 04/27/16 0745 Oral     SpO2 04/27/16 0745 97 %     Weight 04/27/16 0745 224 lb (101.606 kg)     Height 04/27/16 0745  (1.676 m)     Head Cir --      Peak Flow --      Pain Score 04/27/16 0747 0     Pain Loc --      Pain Edu? --      Excl. in GC? --      Constitutional: Alert and oriented. Well appearing and in no acute distress. Eyes: Conjunctivae are normal. PERRL. EOMI without pain.  Head: Atraumatic. ENT:      Ears: TMs visualized bilaterally without erythema, effusion, bulging, perforation.      Nose: No congestion/rhinnorhea.      Mouth/Throat: Mucous membranes are moist. Pharynx without erythema, swelling, exudate. Uvula is midline. Clear postnasal drip and mild cobblestoning noted. Neck: Supple with full range of motion. Hematological/Lymphatic/Immunilogical: No cervical lymphadenopathy. Cardiovascular: Normal rate, regular rhythm. Normal S1 and S2.  Good peripheral circulation. Respiratory: Normal respiratory effort without tachypnea or retractions. Lungs CTAB with breath sounds noted in all lung fields. Neurologic:  Normal speech and language. No gross focal neurologic deficits are appreciated.  Skin:  Skin  is warm, dry and intact. No rash noted. Psychiatric: Mood and affect are normal. Speech and behavior are normal. Patient exhibits appropriate insight and judgement.   ____________________________________________   LABS  None ____________________________________________  EKG  None ____________________________________________  RADIOLOGY I have personally viewed and evaluated these images (plain radiographs) as part of my medical decision making, as well as reviewing the written report by the radiologist.  Dg Chest 2 View  04/27/2016  CLINICAL DATA:  Dry cough for 4 days, dizziness and weakness. Diabetes. EXAM: CHEST  2 VIEW COMPARISON:  None available FINDINGS: The heart size and mediastinal contours are within normal limits. Both lungs are clear. The visualized skeletal structures are unremarkable. IMPRESSION: No active cardiopulmonary disease. Electronically Signed   By: Judie PetitM.  Shick M.D.   On: 04/27/2016 08:08    ____________________________________________    PROCEDURES  Procedure(s) performed: None    Medications - No data to display   ____________________________________________   INITIAL IMPRESSION / ASSESSMENT AND PLAN / ED COURSE  Pertinent imaging results that were available during my care of the patient were reviewed by me and considered in my medical decision making (see chart for details).  Patient's diagnosis is consistent with viral upper respiratory infection. Patient will be discharged home with prescriptions for Bromfed-DM and Flonase to take as directed. Patient given a work note for today. Patient is to follow up with his primary care provider if symptoms persist past this treatment course. Patient is given ED precautions to return to the ED for any worsening or new symptoms.      ____________________________________________  FINAL CLINICAL IMPRESSION(S) / ED DIAGNOSES  Final diagnoses:  Viral upper respiratory infection      NEW MEDICATIONS  STARTED DURING THIS VISIT:  Discharge Medication List as of 04/27/2016  8:15 AM    START taking these medications   Details  brompheniramine-pseudoephedrine-DM 30-2-10 MG/5ML syrup Take 10 mLs by mouth 4 (four) times daily as needed., Starting 04/27/2016, Until Discontinued, Print    fluticasone (FLONASE) 50 MCG/ACT nasal spray Place 2 sprays into both nostrils daily., Starting 04/27/2016, Until Discontinued, Print             Hope PigeonJami L Hagler, PA-C 04/27/16 [id number]  Myrna Blazeravid Matthew Schaevitz, MD 04/27/16 1538

## 2016-04-27 NOTE — Discharge Instructions (Signed)
Upper Respiratory Infection, Adult Most upper respiratory infections (URIs) are a viral infection of the air passages leading to the lungs. A URI affects the nose, throat, and upper air passages. The most common type of URI is nasopharyngitis and is typically referred to as "the common cold." URIs run their course and usually go away on their own. Most of the time, a URI does not require medical attention, but sometimes a bacterial infection in the upper airways can follow a viral infection. This is called a secondary infection. Sinus and middle ear infections are common types of secondary upper respiratory infections. Bacterial pneumonia can also complicate a URI. A URI can worsen asthma and chronic obstructive pulmonary disease (COPD). Sometimes, these complications can require emergency medical care and may be life threatening.  CAUSES Almost all URIs are caused by viruses. A virus is a type of germ and can spread from one person to another.  RISKS FACTORS You may be at risk for a URI if:   You smoke.   You have chronic heart or lung disease.  You have a weakened defense (immune) system.   You are very young or very old.   You have nasal allergies or asthma.  You work in crowded or poorly ventilated areas.  You work in health care facilities or schools. SIGNS AND SYMPTOMS  Symptoms typically develop 2-3 days after you come in contact with a cold virus. Most viral URIs last 7-10 days. However, viral URIs from the influenza virus (flu virus) can last 14-18 days and are typically more severe. Symptoms may include:   Runny or stuffy (congested) nose.   Sneezing.   Cough.   Sore throat.   Headache.   Fatigue.   Fever.   Loss of appetite.   Pain in your forehead, behind your eyes, and over your cheekbones (sinus pain).  Muscle aches.  DIAGNOSIS  Your health care provider may diagnose a URI by:  Physical exam.  Tests to check that your symptoms are not due to  another condition such as:  Strep throat.  Sinusitis.  Pneumonia.  Asthma. TREATMENT  A URI goes away on its own with time. It cannot be cured with medicines, but medicines may be prescribed or recommended to relieve symptoms. Medicines may help:  Reduce your fever.  Reduce your cough.  Relieve nasal congestion. HOME CARE INSTRUCTIONS   Take medicines only as directed by your health care provider.   Gargle warm saltwater or take cough drops to comfort your throat as directed by your health care provider.  Use a warm mist humidifier or inhale steam from a shower to increase air moisture. This may make it easier to breathe.  Drink enough fluid to keep your urine clear or pale yellow.   Eat soups and other clear broths and maintain good nutrition.   Rest as needed.   Return to work when your temperature has returned to normal or as your health care provider advises. You may need to stay home longer to avoid infecting others. You can also use a face mask and careful hand washing to prevent spread of the virus.  Increase the usage of your inhaler if you have asthma.   Do not use any tobacco products, including cigarettes, chewing tobacco, or electronic cigarettes. If you need help quitting, ask your health care provider. PREVENTION  The best way to protect yourself from getting a cold is to practice good hygiene.   Avoid oral or hand contact with people with cold   symptoms.   Wash your hands often if contact occurs.  There is no clear evidence that vitamin C, vitamin E, echinacea, or exercise reduces the chance of developing a cold. However, it is always recommended to get plenty of rest, exercise, and practice good nutrition.  SEEK MEDICAL CARE IF:   You are getting worse rather than better.   Your symptoms are not controlled by medicine.   You have chills.  You have worsening shortness of breath.  You have brown or red mucus.  You have yellow or brown nasal  discharge.  You have pain in your face, especially when you bend forward.  You have a fever.  You have swollen neck glands.  You have pain while swallowing.  You have white areas in the back of your throat. SEEK IMMEDIATE MEDICAL CARE IF:   You have severe or persistent:  Headache.  Ear pain.  Sinus pain.  Chest pain.  You have chronic lung disease and any of the following:  Wheezing.  Prolonged cough.  Coughing up blood.  A change in your usual mucus.  You have a stiff neck.  You have changes in your:  Vision.  Hearing.  Thinking.  Mood. MAKE SURE YOU:   Understand these instructions.  Will watch your condition.  Will get help right away if you are not doing well or get worse.   This information is not intended to replace advice given to you by your health care provider. Make sure you discuss any questions you have with your health care provider.   Document Released: 04/21/2001 Document Revised: 03/12/2015 Document Reviewed: 01/31/2014 Elsevier Interactive Patient Education 2016 [employer]  

## 2016-04-27 NOTE — ED Notes (Signed)
Presents with cough for several days  Afebrile on arrival  Able to speak full sentences cough is non prod at present

## 2016-04-27 NOTE — ED Notes (Signed)
Pt c/o cough with congestion for the past 9-10 days.

## 2016-05-27 NOTE — ED Provider Notes (Signed)
CSN: [id number]     Arrival date & time 04/06/16  1711 History   First MD Initiated Contact with Patient 04/06/16 1912     Chief Complaint  Patient presents with  . Arm Pain   (Consider location/radiation/quality/duration/timing/severity/associated sxs/prior Treatment) HPI Comments: 40 yo male with a c/o left arm (near elbow) pain for the past week. Denies any redness, rash, numbness/tingling, swelling.   Patient is a 40 y.o. male presenting with arm pain. The history is provided by the patient.  Arm Pain    Past Medical History  Diagnosis Date  . Diabetes mellitus without complication [facility](HCC)    Past Surgical History  Procedure Laterality Date  . Leg surgery     History reviewed. No pertinent family history. Social History  Substance Use Topics  . Smoking status: Former Games developermoker  . Smokeless tobacco: None  . Alcohol Use: Yes     Comment: occasional    Review of Systems  Allergies  Review of patient's allergies indicates no known allergies.  Home Medications   Prior to Admission medications   Medication Sig Start Date End Date Taking? Authorizing Provider  brompheniramine-pseudoephedrine-DM 30-2-10 MG/5ML syrup Take 10 mLs by mouth 4 (four) times daily as needed. 04/27/16   Jami L Hagler, PA-C  fluticasone (FLONASE) 50 MCG/ACT nasal spray Place 2 sprays into both nostrils daily. 04/27/16   Jami L Hagler, PA-C  METFORMIN HCL PO Take by mouth.    Historical Provider, MD   Meds Ordered and Administered this Visit  Medications - No data to display  BP 120/77 mmHg  Pulse 83  Temp(Src) 97.9 F (36.6 C) (Oral)  Resp 18  Ht 5\' 7"  (1.702 m)  Wt 224 lb (101.606 kg)  BMI 35.08 kg/m2  SpO2 99% No data found.   Physical Exam  Constitutional: He appears well-developed and well-nourished. No distress.  Musculoskeletal:       Left elbow: He exhibits normal range of motion, no swelling, no effusion, no deformity and no laceration. Tenderness found. Lateral epicondyle  tenderness noted.  Skin: He is not diaphoretic.  Nursing note and vitals reviewed.   ED Course  Procedures (including critical care time)  Labs Review Labs Reviewed - No data to display  Imaging Review No results found.   Visual Acuity Review  Right Eye Distance:   Left Eye Distance:   Bilateral Distance:    Right Eye Near:   Left Eye Near:    Bilateral Near:         MDM   1. Lateral epicondylitis, left    Discharge Medication List as of 04/06/2016  7:27 PM     1.  diagnosis reviewed with patient 2. rx as per orders above; reviewed possible side effects, interactions, risks and benefits  3. Recommend supportive treatment with rest, ice  4. Follow-up prn if symptoms worsen or don't improve    Payton Mccallumrlando Damar Petit, MD 05/27/16 1912

## 2016-07-14 ENCOUNTER — Encounter: Payer: Self-pay | Admitting: Medical Oncology

## 2016-07-14 ENCOUNTER — Emergency Department
Admission: EM | Admit: 2016-07-14 | Discharge: 2016-07-14 | Disposition: A | Discharge status: Home / Self Care | Payer: BLUE CROSS/BLUE SHIELD | Attending: Emergency Medicine | Admitting: Emergency Medicine

## 2016-07-14 ENCOUNTER — Emergency Department: Payer: BLUE CROSS/BLUE SHIELD

## 2016-07-14 DIAGNOSIS — Y92832 Beach as the place of occurrence of the external cause: Secondary | ICD-10-CM | POA: Insufficient documentation

## 2016-07-14 DIAGNOSIS — S6991XA Unspecified injury of right wrist, hand and finger(s), initial encounter: Secondary | ICD-10-CM | POA: Diagnosis present

## 2016-07-14 DIAGNOSIS — Y999 Unspecified external cause status: Secondary | ICD-10-CM | POA: Diagnosis not present

## 2016-07-14 DIAGNOSIS — Z79899 Other long term (current) drug therapy: Secondary | ICD-10-CM | POA: Insufficient documentation

## 2016-07-14 DIAGNOSIS — E119 Type 2 diabetes mellitus without complications: Secondary | ICD-10-CM | POA: Insufficient documentation

## 2016-07-14 DIAGNOSIS — W19XXXA Unspecified fall, initial encounter: Secondary | ICD-10-CM | POA: Diagnosis not present

## 2016-07-14 DIAGNOSIS — S60221A Contusion of right hand, initial encounter: Secondary | ICD-10-CM

## 2016-07-14 DIAGNOSIS — Z7984 Long term (current) use of oral hypoglycemic drugs: Secondary | ICD-10-CM | POA: Insufficient documentation

## 2016-07-14 DIAGNOSIS — Z87891 Personal history of nicotine dependence: Secondary | ICD-10-CM | POA: Diagnosis not present

## 2016-07-14 DIAGNOSIS — Y939 Activity, unspecified: Secondary | ICD-10-CM | POA: Insufficient documentation

## 2016-07-14 MED ORDER — NAPROXEN 500 MG PO TABS: 500.0000 mg | ORAL_TABLET | Freq: Two times a day (BID) | ORAL | 0 refills | Status: DC

## 2016-07-14 NOTE — ED Notes (Signed)
Pt alert and oriented X4, active, cooperative, pt in NAD. RR even and unlabored, color WNL.  Pt informed to return if any life threatening symptoms occur.   
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
## 2019-05-27 ENCOUNTER — Encounter: Payer: Self-pay | Admitting: Emergency Medicine

## 2019-05-27 ENCOUNTER — Other Ambulatory Visit: Payer: Self-pay

## 2019-05-27 DIAGNOSIS — U071 COVID-19: Secondary | ICD-10-CM | POA: Insufficient documentation

## 2019-05-27 DIAGNOSIS — Z7984 Long term (current) use of oral hypoglycemic drugs: Secondary | ICD-10-CM | POA: Diagnosis not present

## 2019-05-27 DIAGNOSIS — E119 Type 2 diabetes mellitus without complications: Secondary | ICD-10-CM | POA: Diagnosis not present

## 2019-05-27 DIAGNOSIS — R430 Anosmia: Secondary | ICD-10-CM | POA: Diagnosis present

## 2019-05-27 DIAGNOSIS — R197 Diarrhea, unspecified: Secondary | ICD-10-CM | POA: Insufficient documentation

## 2019-05-27 DIAGNOSIS — R05 Cough: Secondary | ICD-10-CM | POA: Diagnosis not present

## 2019-05-27 DIAGNOSIS — R0602 Shortness of breath: Secondary | ICD-10-CM | POA: Diagnosis not present

## 2019-05-27 DIAGNOSIS — Z87891 Personal history of nicotine dependence: Secondary | ICD-10-CM | POA: Insufficient documentation

## 2019-05-27 NOTE — ED Triage Notes (Signed)
Pr says he had a cough 3-4 days ago that has now since stopped; diarrhea 4 days ago that lasted 2 days; some shortness of breath that continues today; yesterday he lost his sense of smell and that has not returned;

## 2019-05-27 NOTE — ED Notes (Signed)
Patient reports he has had fever, chills, diarrhea and today lost sense of smell.

## 2019-05-28 ENCOUNTER — Emergency Department
Admission: EM | Admit: 2019-05-28 | Discharge: 2019-05-28 | Disposition: A | Discharge status: Home / Self Care | Payer: BC Managed Care – PPO | Attending: Emergency Medicine | Admitting: Emergency Medicine

## 2019-05-28 ENCOUNTER — Emergency Department: Payer: BC Managed Care – PPO

## 2019-05-28 DIAGNOSIS — Z20822 Contact with and (suspected) exposure to covid-19: Secondary | ICD-10-CM

## 2019-05-28 LAB — CBC WITH DIFFERENTIAL/PLATELET
Abs Immature Granulocytes: 0.02 10*3/uL (ref 0.00–0.07)
Basophils Absolute: 0 10*3/uL (ref 0.0–0.1)
Basophils Relative: 0 %
Eosinophils Absolute: 0.1 10*3/uL (ref 0.0–0.5)
Eosinophils Relative: 1 %
HCT: 42.1 % (ref 39.0–52.0)
Hemoglobin: 14.3 g/dL (ref 13.0–17.0)
Immature Granulocytes: 0 %
Lymphocytes Relative: 50 %
Lymphs Abs: 3 10*3/uL (ref 0.7–4.0)
MCH: 30 pg (ref 26.0–34.0)
MCHC: 34 g/dL (ref 30.0–36.0)
MCV: 88.3 fL (ref 80.0–100.0)
Monocytes Absolute: 0.3 10*3/uL (ref 0.1–1.0)
Monocytes Relative: 6 %
Neutro Abs: 2.6 10*3/uL (ref 1.7–7.7)
Neutrophils Relative %: 43 %
Platelets: 207 10*3/uL (ref 150–400)
RBC: 4.77 MIL/uL (ref 4.22–5.81)
RDW: 12.4 % (ref 11.5–15.5)
WBC: 6 10*3/uL (ref 4.0–10.5)
nRBC: 0 % (ref 0.0–0.2)

## 2019-05-28 LAB — COMPREHENSIVE METABOLIC PANEL
ALT: 30 U/L (ref 0–44)
AST: 23 U/L (ref 15–41)
Albumin: 4 g/dL (ref 3.5–5.0)
Alkaline Phosphatase: 93 U/L (ref 38–126)
Anion gap: 8 (ref 5–15)
BUN: 15 mg/dL (ref 6–20)
CO2: 27 mmol/L (ref 22–32)
Calcium: 8.6 mg/dL — ABNORMAL LOW (ref 8.9–10.3)
Chloride: 103 mmol/L (ref 98–111)
Creatinine, Ser: 0.82 mg/dL (ref 0.61–1.24)
GFR calc Af Amer: >60 mL/min (ref >60)
GFR calc non Af Amer: >60 mL/min (ref >60)
Glucose, Bld: 182 mg/dL — ABNORMAL HIGH (ref 70–99)
Potassium: 4.2 mmol/L (ref 3.5–5.1)
Sodium: 138 mmol/L (ref 135–145)
Total Bilirubin: 0.5 mg/dL (ref 0.3–1.2)
Total Protein: 7 g/dL (ref 6.5–8.1)

## 2019-05-28 LAB — TROPONIN I (HIGH SENSITIVITY): Troponin I (High Sensitivity): 3 ng/L (ref <18)

## 2019-05-28 NOTE — ED Provider Notes (Signed)
[facility] Emergency Department Provider Note  ____________________________________________  Time seen: Approximately 1:06 AM  I have reviewed the triage vital signs and the nursing notes.   HISTORY  Chief Complaint loss of smell, Diarrhea, Shortness of Breath, and Cough   HPI Roberto Garza is a 43 y.o. male with a history of diabetes who presents for evaluation of COVID-like symptoms.  Patient has had 5 days of subjective fever, several daily episodes of diarrhea, cough, and body aches.  Today patient had loss of sense of smell.  Also started having mild shortness of breath which has been intermittent.  He denies any shortness of breath at this time.  He denies any chest pain.  He denies any known exposures to COVID.   Past Medical History:  Diagnosis Date   Diabetes mellitus without complication (HCC)     Past Surgical History:  Procedure Laterality Date   LEG SURGERY      Prior to Admission medications   Medication Sig Start Date End Date Taking? Authorizing Provider  glyBURIDE (DIABETA) 5 MG tablet Take 5 mg by mouth 2 (two) times daily with a meal.   Yes [provider]  METFORMIN HCL PO Take 1,000 mg by mouth 2 (two) times a day.    Yes [provider]    Allergies Patient has no known allergies.  History reviewed. No pertinent family history.  Social History Social History   Tobacco Use   Smoking status: Former Smoker   Smokeless tobacco: Never Used  Substance Use Topics   Alcohol use: Not Currently    Comment: occasional   Drug use: No    Review of Systems  Constitutional: + fever, body aches Eyes: Negative for visual changes. ENT: Negative for sore throat. Neck: No neck pain  Cardiovascular: Negative for chest pain. Respiratory: + shortness of breath, cough Gastrointestinal: Negative for abdominal pain, vomiting. + diarrhea. Genitourinary: Negative for dysuria. Musculoskeletal: Negative for  back pain. Skin: Negative for rash. Neurological: Negative for headaches, weakness or numbness. Psych: No SI or HI  ____________________________________________   PHYSICAL EXAM:  VITAL SIGNS: ED Triage Vitals  Enc Vitals Group     BP 05/27/19 2126 140/87     Pulse Rate 05/27/19 2126 66     Resp 05/27/19 2126 16     Temp 05/27/19 2126 98.7 F (37.1 C)     Temp Source 05/27/19 2117 Oral     SpO2 05/27/19 2126 99 %     Weight 05/27/19 2118 220 lb 0.3 oz (99.8 kg)     Height 05/27/19 2118 5\' 7"  (1.702 m)     Head Circumference --      Peak Flow --      Pain Score 05/27/19 2117 0     Pain Loc --      Pain Edu? --      Excl. in GC? --     Constitutional: Alert and oriented. Well appearing and in no apparent distress. HEENT:      Head: Normocephalic and atraumatic.         Eyes: Conjunctivae are normal. Sclera is non-icteric.       Mouth/Throat: Mucous membranes are moist.       Neck: Supple with no signs of meningismus. Cardiovascular: Regular rate and rhythm. No murmurs, gallops, or rubs. 2+ symmetrical distal pulses are present in all extremities. No JVD. Respiratory: Normal respiratory effort. Lungs are clear to auscultation bilaterally. No wheezes, crackles, or rhonchi.  Gastrointestinal: Soft,  non tender, and non distended with positive bowel sounds. No rebound or guarding. Musculoskeletal: Nontender with normal range of motion in all extremities. No edema, cyanosis, or erythema of extremities. Neurologic: Normal speech and language. Face is symmetric. Moving all extremities. No gross focal neurologic deficits are appreciated. Skin: Skin is warm, dry and intact. No rash noted. Psychiatric: Mood and affect are normal. Speech and behavior are normal.  ____________________________________________   LABS (all labs ordered are listed, but only abnormal results are displayed)  Labs Reviewed  COMPREHENSIVE METABOLIC PANEL - Abnormal; Notable for the following components:       Result Value   Glucose, Bld 182 (*)    Calcium 8.6 (*)    All other components within normal limits  NOVEL CORONAVIRUS, NAA (HOSPITAL ORDER, SEND-OUT TO REF LAB)  CBC WITH DIFFERENTIAL/PLATELET  TROPONIN I (HIGH SENSITIVITY)   ____________________________________________  EKG  ED ECG REPORT I, Rudene Re, the attending physician, personally viewed and interpreted this ECG.  Sinus bradycardia, rate of 55, normal intervals, normal axis, low voltage QRS, no ST elevations or depressions.  Otherwise normal EKG. ____________________________________________  RADIOLOGY  I have personally reviewed the images performed during this visit and I agree with the Radiologist's read.   Interpretation by Radiologist:  Dg Chest Portable 1 View  Result Date: 05/28/2019 CLINICAL DATA:  Fever, chills, loss sense of smell. COVID-19 like symptoms. EXAM: PORTABLE CHEST 1 VIEW COMPARISON:  04/27/2016 FINDINGS: Low lung volumes.The cardiomediastinal contours are normal. Pulmonary vasculature is normal. No consolidation, pleural effusion, or pneumothorax. No acute osseous abnormalities are seen. IMPRESSION: Low lung volumes without acute abnormality. Electronically Signed   By: Keith Rake M.D.   On: 05/28/2019 01:41      ____________________________________________   PROCEDURES  Procedure(s) performed: None Procedures Critical Care performed:  None ____________________________________________   INITIAL IMPRESSION / ASSESSMENT AND PLAN / ED COURSE  43 y.o. male with a history of diabetes who presents for evaluation of COVID-like symptoms x 5 days. Strong clinical suspicion for COVID.  Patient is extremely well-appearing and in no distress with normal vital signs, normal work of breathing, 100% sats both at rest and with ambulation. Labs, EKG, CXR with no acute findings. Covid swab sent. Discussed with patient that my clinical suspicion is high enough that even if COVID swab is negative I  still recommended quarantine (test used at [facility] has a reported false negative rate of 20%) until 7 days of no symptoms.  Also recommended 14-day quarantine for household members.  Recommended getting a pulse oximeter and checking his oxygenation 3 times daily and returning to the emergency room if results are 92% or less.  Discussed return precautions and follow-up with primary care doctor.     As part of my medical decision making, I reviewed the following data within the [place] notes reviewed and incorporated, Labs reviewed , EKG interpreted , Old chart reviewed, Radiograph reviewed , Notes from prior ED visits and [place] Controlled Substance Database    Patient was evaluated in Emergency Department today for the symptoms described in the history of present illness. Patient was evaluated in the context of the global COVID-19 pandemic, which necessitated consideration that the patient might be at risk for infection with the SARS-CoV-2 virus that causes COVID-19. Institutional protocols and algorithms that pertain to the evaluation of patients at risk for COVID-19 are in a state of rapid change based on information released by regulatory bodies including the CDC and federal and state organizations.  These policies and algorithms were followed during the patient's care in the ED.  ____________________________________________   FINAL CLINICAL IMPRESSION(S) / ED DIAGNOSES   Final diagnoses:  Suspected Covid-19 Virus Infection      NEW MEDICATIONS STARTED DURING THIS VISIT:  ED Discharge Orders    None       Note:  This document was prepared using Dragon voice recognition software and may include unintentional dictation errors.    Don PerkingVeronese, [place], MD 05/28/19 [phone]

## 2019-05-28 NOTE — Discharge Instructions (Addendum)
QUARANTINE INSTRUCTION  Follow these instructions at home:  Protecting others To avoid spreading the illness to other people: Quarantine in your home until you have had no cough and fever for 7 days. Household members should also be quarantine for at least 14 days after being exposed to you. Wash your hands often with soap and water. If soap and water are not available, use an alcohol-based hand sanitizer. If you have not cleaned your hands, do not touch your face. Make sure that all people in your household wash their hands well and often. Cover your nose and mouth when you cough or sneeze. Throw away used tissues. Stay home if you have any cold-like or flu-like symptoms. General instructions Go to your local pharmacy and buy a pulse oximeter (this is a machine that measures your oxygen). Check your oxygen levels at least 3 times a day. If your oxygen level is 92% or less return to the emergency room immediately Take over-the-counter and prescription medicines only as told by your health care provider. If you need medication for fever take tylenol or ibuprofen Drink enough fluid to keep your urine pale yellow. Rest at home as directed by your health care provider. Do not give aspirin to a child with the flu, because of the association with Reye's syndrome. Do not use tobacco products, including cigarettes, chewing tobacco, and e-cigarettes. If you need help quitting, ask your health care provider. Keep all follow-up visits as told by your health care provider. This is important. How is this prevented? Avoid areas where an outbreak has been reported. Avoid large groups of people. Keep a safe distance from people who are coughing and sneezing. Do not touch your face if you have not cleaned your hands. When you are around people who are sick or might be sick, wear a mask to protect yourself. Contact a health care provider if: You have symptoms of SARS (cough, fever, chest pain, shortness of  breath) that are not getting better at home. You have a fever. If you have difficulty breathing call 911 go to your local ER.

## 2019-05-28 NOTE — ED Notes (Signed)
Ambulatory pulse ox of 100%.

## 2019-05-28 NOTE — ED Notes (Signed)
Pt states 5 days of shob, fever, diarrhea and loss of smell. Pt ambulatory with strong cough but no resp distress noted.

## 2019-06-01 ENCOUNTER — Telehealth: Payer: Self-pay | Admitting: Emergency Medicine

## 2019-06-01 NOTE — Telephone Encounter (Signed)
Called patient and informed him of positive covid 19 test.  Explained cdc guidelines for coming out of isolation and 14 day isolation for any contacts.  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

## 2019-06-02 LAB — NOVEL CORONAVIRUS, NAA (HOSP ORDER, SEND-OUT TO REF LAB; TAT 18-24 HRS): SARS-CoV-2, NAA: DETECTED — AB

## 2020-02-04 ENCOUNTER — Ambulatory Visit: Payer: BC Managed Care – PPO | Attending: Internal Medicine

## 2020-02-04 DIAGNOSIS — Z23 Encounter for immunization: Secondary | ICD-10-CM

## 2020-02-04 NOTE — Progress Notes (Signed)
   Covid-19 Vaccination Clinic  Name:  Roberto Garza    MRN: [id number] DOB: Oct 06, 1976  02/04/2020  [name] was observed post Covid-19 immunization for 15 minutes without incident. He was provided with Vaccine Information Sheet and instruction to access the V-Safe system.   [name] was instructed to call 911 with any severe reactions post vaccine: Marland Kitchen Difficulty breathing  . Swelling of face and throat  . A fast heartbeat  . A bad rash all over body  . Dizziness and weakness   Immunizations Administered    Name Date Dose VIS Date Route   Pfizer COVID-19 Vaccine 02/04/2020  5:30 PM 0.3 mL 10/20/2019 Intramuscular   Manufacturer: [employer], [employer]   Lot: [id number]   NDC: [address]-9

## 2020-02-25 ENCOUNTER — Ambulatory Visit: Payer: BC Managed Care – PPO | Attending: Internal Medicine

## 2020-02-25 DIAGNOSIS — Z23 Encounter for immunization: Secondary | ICD-10-CM

## 2020-02-25 NOTE — Progress Notes (Signed)
   Covid-19 Vaccination Clinic  Name:  Roberto Garza    MRN: [id number] DOB: 1976/05/15  02/25/2020  Mr. Ice was observed post Covid-19 immunization for 15 minutes without incident. He was provided with Vaccine Information Sheet and instruction to access the V-Safe system.   [name] was instructed to call 911 with any severe reactions post vaccine: Marland Kitchen Difficulty breathing  . Swelling of face and throat  . A fast heartbeat  . A bad rash all over body  . Dizziness and weakness   Immunizations Administered    Name Date Dose VIS Date Route   Pfizer COVID-19 Vaccine 02/25/2020  5:23 PM 0.3 mL 10/20/2019 Intramuscular   Manufacturer: [employer], [employer]   Lot: [id number]   NDC: [address]-1

## 2020-08-05 ENCOUNTER — Emergency Department: Payer: PRIVATE HEALTH INSURANCE

## 2020-08-05 ENCOUNTER — Encounter: Payer: Self-pay | Admitting: *Deleted

## 2020-08-05 ENCOUNTER — Other Ambulatory Visit: Payer: Self-pay

## 2020-08-05 ENCOUNTER — Emergency Department
Admission: EM | Admit: 2020-08-05 | Discharge: 2020-08-05 | Disposition: A | Discharge status: Home / Self Care | Payer: PRIVATE HEALTH INSURANCE | Attending: Emergency Medicine | Admitting: Emergency Medicine

## 2020-08-05 DIAGNOSIS — S300XXA Contusion of lower back and pelvis, initial encounter: Secondary | ICD-10-CM | POA: Diagnosis not present

## 2020-08-05 DIAGNOSIS — Z7984 Long term (current) use of oral hypoglycemic drugs: Secondary | ICD-10-CM | POA: Diagnosis not present

## 2020-08-05 DIAGNOSIS — Y929 Unspecified place or not applicable: Secondary | ICD-10-CM | POA: Insufficient documentation

## 2020-08-05 DIAGNOSIS — Y990 Civilian activity done for income or pay: Secondary | ICD-10-CM | POA: Diagnosis not present

## 2020-08-05 DIAGNOSIS — E119 Type 2 diabetes mellitus without complications: Secondary | ICD-10-CM | POA: Insufficient documentation

## 2020-08-05 DIAGNOSIS — Z87891 Personal history of nicotine dependence: Secondary | ICD-10-CM | POA: Insufficient documentation

## 2020-08-05 DIAGNOSIS — W208XXA Other cause of strike by thrown, projected or falling object, initial encounter: Secondary | ICD-10-CM | POA: Diagnosis not present

## 2020-08-05 DIAGNOSIS — Y939 Activity, unspecified: Secondary | ICD-10-CM | POA: Insufficient documentation

## 2020-08-05 DIAGNOSIS — S3992XA Unspecified injury of lower back, initial encounter: Secondary | ICD-10-CM | POA: Diagnosis present

## 2020-08-05 MED ORDER — METHOCARBAMOL 500 MG PO TABS: 500.0000 mg | ORAL_TABLET | Freq: Four times a day (QID) | ORAL | 0 refills | Status: AC

## 2020-08-05 MED ORDER — OXYCODONE-ACETAMINOPHEN 5-325 MG PO TABS
1.0000 | ORAL_TABLET | Freq: Once | ORAL | Status: AC
  Administered 2020-08-05: 1 via ORAL
  Filled 2020-08-05: qty 1

## 2020-08-05 MED ORDER — MELOXICAM 15 MG PO TABS: 15.0000 mg | ORAL_TABLET | Freq: Every day | ORAL | 0 refills | Status: AC

## 2020-08-05 MED ORDER — KETOROLAC TROMETHAMINE 30 MG/ML IJ SOLN
30.0000 mg | Freq: Once | INTRAMUSCULAR | Status: AC
  Administered 2020-08-05: 30 mg via INTRAMUSCULAR
  Filled 2020-08-05: qty 1

## 2020-08-05 MED ORDER — ORPHENADRINE CITRATE 30 MG/ML IJ SOLN
60.0000 mg | Freq: Once | INTRAMUSCULAR | Status: AC
  Administered 2020-08-05: 60 mg via INTRAMUSCULAR
  Filled 2020-08-05: qty 2

## 2020-08-05 MED ORDER — OXYCODONE-ACETAMINOPHEN 5-325 MG PO TABS: 1.0000 | ORAL_TABLET | Freq: Four times a day (QID) | ORAL | 0 refills | Status: AC | PRN

## 2020-08-05 NOTE — ED Notes (Signed)
Patient preferred to remain in wheelchair due to pain. No abrasions noted on back. Patient c/o right lower lumbar pain.

## 2020-08-05 NOTE — ED Notes (Signed)
Attempted to call patient's supervisor Tim at [phone], but there was no answer. Employer is not on worker's comp list.

## 2020-08-05 NOTE — ED Triage Notes (Addendum)
Pt brought in via ems from home. Pt has lower back pain.  States injured back today at work. States a concrete block fell onto his back.  Pt alert speech clear.   States WC

## 2020-08-05 NOTE — ED Provider Notes (Signed)
[facility] Emergency Department Provider Note  ____________________________________________  Time seen: Approximately 10:25 PM  I have reviewed the triage vital signs and the nursing notes.   HISTORY  Chief Complaint Back Pain    HPI Cru Kritikos Heo is a 44 y.o. male who presents the emergency department for evaluation of back pain.  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  Patient states that he was able to get himself home, as he was trying to get into the house he had a sharp pain in his right lower back.  No radicular symptoms.  No bowel or bladder dysfunction, saddle anesthesia or paresthesias.  Patient states that the block landed on his         Past Medical History:  Diagnosis Date  . Diabetes mellitus without complication (HCC)     There are no problems to display for this patient.   Past Surgical History:  Procedure Laterality Date  . LEG SURGERY      Prior to Admission medications   Medication Sig Start Date End Date Taking? Authorizing Provider  glyBURIDE (DIABETA) 5 MG tablet Take 5 mg by mouth 2 (two) times daily with a meal.    [provider]  meloxicam (MOBIC) 15 MG tablet Take 1 tablet (15 mg total) by mouth daily. 08/05/20   Zlatan Hornback, Delorise Royals, PA-C  METFORMIN HCL PO Take 1,000 mg by mouth 2 (two) times a day.     [provider]  methocarbamol (ROBAXIN) 500 MG tablet Take 1 tablet (500 mg total) by mouth 4 (four) times daily. 08/05/20   Ananya Mccleese, Delorise Royals, PA-C  oxyCODONE-acetaminophen (PERCOCET/ROXICET) 5-325 MG tablet Take 1 tablet by mouth every 6 (six) hours as needed for severe pain. 08/05/20   Jazlynne Milliner, Delorise Royals, PA-C    Allergies Patient has no known allergies.  No family history on file.  Social History Social History   Tobacco Use  . Smoking  status: Former Games developer  . Smokeless tobacco: Never Used  Substance Use Topics  . Alcohol use: Not Currently    Comment: occasional  . Drug use: No     Review of Systems  Constitutional: No fever/chills Eyes: No visual changes. No discharge ENT: No upper respiratory complaints. Cardiovascular: no chest pain. Respiratory: no cough. No SOB. Gastrointestinal: No abdominal pain.  No nausea, no vomiting.  No diarrhea.  No constipation. Genitourinary: Negative for dysuria. No hematuria Musculoskeletal: Right lower back pain after a block of concrete fell in the lower back Skin: Negative for rash, abrasions, lacerations, ecchymosis. Neurological: Negative for headaches, focal weakness or numbness. 10-point ROS otherwise negative.  ____________________________________________   PHYSICAL EXAM:  VITAL SIGNS: ED Triage Vitals  Enc Vitals Group     BP 08/05/20 2036 (!) 169/96     Pulse Rate 08/05/20 2036 93     Resp 08/05/20 2036 20     Temp 08/05/20 2036 98.6 F (37 C)     Temp Source 08/05/20 2036 Oral     SpO2 08/05/20 2036 99 %     Weight 08/05/20 2037 200 lb (90.7 kg)     Height 08/05/20 2037 5\' 6"  (1.676 m)     Head Circumference --      Peak Flow --      Pain Score 08/05/20 2036 10     Pain Loc --  Pain Edu? --      Excl. in GC? --      Constitutional: Alert and oriented. Well appearing and in no acute distress. Eyes: Conjunctivae are normal. PERRL. EOMI. Head: Atraumatic. ENT:      Ears:       Nose: No congestion/rhinnorhea.      Mouth/Throat: Mucous membranes are moist.  Neck: No stridor.  No cervical spine tenderness to palpation.  Cardiovascular: Normal rate, regular rhythm. Normal S1 and S2.  Good peripheral circulation. Respiratory: Normal respiratory effort without tachypnea or retractions. Lungs CTAB. Good air entry to the bases with no decreased or absent breath sounds. Musculoskeletal: Full range of motion to all extremities. No gross deformities  appreciated.  Visualization of the lower back reveals no abrasions, lacerations, significant ecchymosis.  Patient with tenderness along the right lumbar spinal border, right paraspinal muscles, extending to the SI joint.  No palpable abnormality in this region.  No tenderness to palpation over the thoracic spine.  No tenderness along the iliac crest.  Dorsalis pedis pulses sensation intact bilateral lower extremities. Neurologic:  Normal speech and language. No gross focal neurologic deficits are appreciated.  Skin:  Skin is warm, dry and intact. No rash noted. Psychiatric: Mood and affect are normal. Speech and behavior are normal. Patient exhibits appropriate insight and judgement.   ____________________________________________   LABS (all labs ordered are listed, but only abnormal results are displayed)  Labs Reviewed - No data to display ____________________________________________  EKG   ____________________________________________  RADIOLOGY I personally viewed and evaluated these images as part of my medical decision making, as well as reviewing the written report by the radiologist.  DG Lumbar Spine 2-3 Views  Result Date: 08/05/2020 CLINICAL DATA:  cinder block fell on back, low back pain EXAM: LUMBAR SPINE - 2-3 VIEW COMPARISON:  None. FINDINGS: Straightening of normal lordosis. Mild broad-based rightward curvature. No acute fracture. The vertebral body heights are normal. The posterior elements are intact. Mild endplate spurring at L1-L2 and L5-S1 with preservation of disc spaces. The sacroiliac joints are congruent. IMPRESSION: 1. No acute fracture or subluxation of the lumbar spine. 2. Mild degenerative endplate spurring. Electronically Signed   By: Narda Rutherford M.D.   On: 08/05/2020 22:21    ____________________________________________    PROCEDURES  Procedure(s) performed:    Procedures    Medications  oxyCODONE-acetaminophen (PERCOCET/ROXICET) 5-325 MG per  tablet 1 tablet (has no administration in time range)  ketorolac (TORADOL) 30 MG/ML injection 30 mg (has no administration in time range)  orphenadrine (NORFLEX) injection 60 mg (has no administration in time range)     ____________________________________________   INITIAL IMPRESSION / ASSESSMENT AND PLAN / ED COURSE  Pertinent labs & imaging results that were available during my care of the patient were reviewed by me and considered in my medical decision making (see chart for details).  Review of the [place] CSRS was performed in accordance of the [facility] prior to dispensing any controlled drugs.           Patient's diagnosis is consistent with back contusion.  Patient presented to emergency department after a concrete block fell off of a scaffolding and struck the patient's right lower back.  Overall exam was reassuring.  No tenderness in to the abdominal wall.  Imaging reveals no acute fracture.  Patient is neurologically intact.  Patient will be prescribed symptom control medications.  Follow-up with primary care as needed. Patient will be discharged home with prescriptions for Roxicet, meloxicam, Robaxin.Patient is  given ED precautions to return to the ED for any worsening or new symptoms.     ____________________________________________  FINAL CLINICAL IMPRESSION(S) / ED DIAGNOSES  Final diagnoses:  Contusion of lower back, initial encounter      NEW MEDICATIONS STARTED DURING THIS VISIT:  ED Discharge Orders         Ordered    meloxicam (MOBIC) 15 MG tablet  Daily        08/05/20 2245    methocarbamol (ROBAXIN) 500 MG tablet  4 times daily        08/05/20 2245    oxyCODONE-acetaminophen (PERCOCET/ROXICET) 5-325 MG tablet  Every 6 hours PRN        08/05/20 2245              This chart was dictated using voice recognition software/Dragon. Despite best efforts to proofread, errors can occur which can change the meaning. Any change was purely unintentional.     Racheal Patches, PA-C 08/05/20 2248    Phineas Semen, MD 08/05/20 [phone]

## 2020-08-05 NOTE — ED Notes (Signed)
Pt alert and oriented X 4, stable for discharge. RR even and unlabored, color WNL. Discussed discharge instructions and follow-up as directed. Discharge medications discussed if provided. Pt had opportunity to ask questions if necessary and RN to provide patient/family eduction.  

## 2020-11-17 IMAGING — DX PORTABLE CHEST - 1 VIEW
1 series · 2 of 2 positions shown · non-contrast
Comparison: 04/27/2016

CLINICAL DATA: Fever, chills, loss sense of smell. [id number] like
symptoms.

EXAM:
PORTABLE CHEST 1 VIEW

[Series 1: chest ap · 0.14mm/px · 2 of 2 slices shown]
[im 1/2]
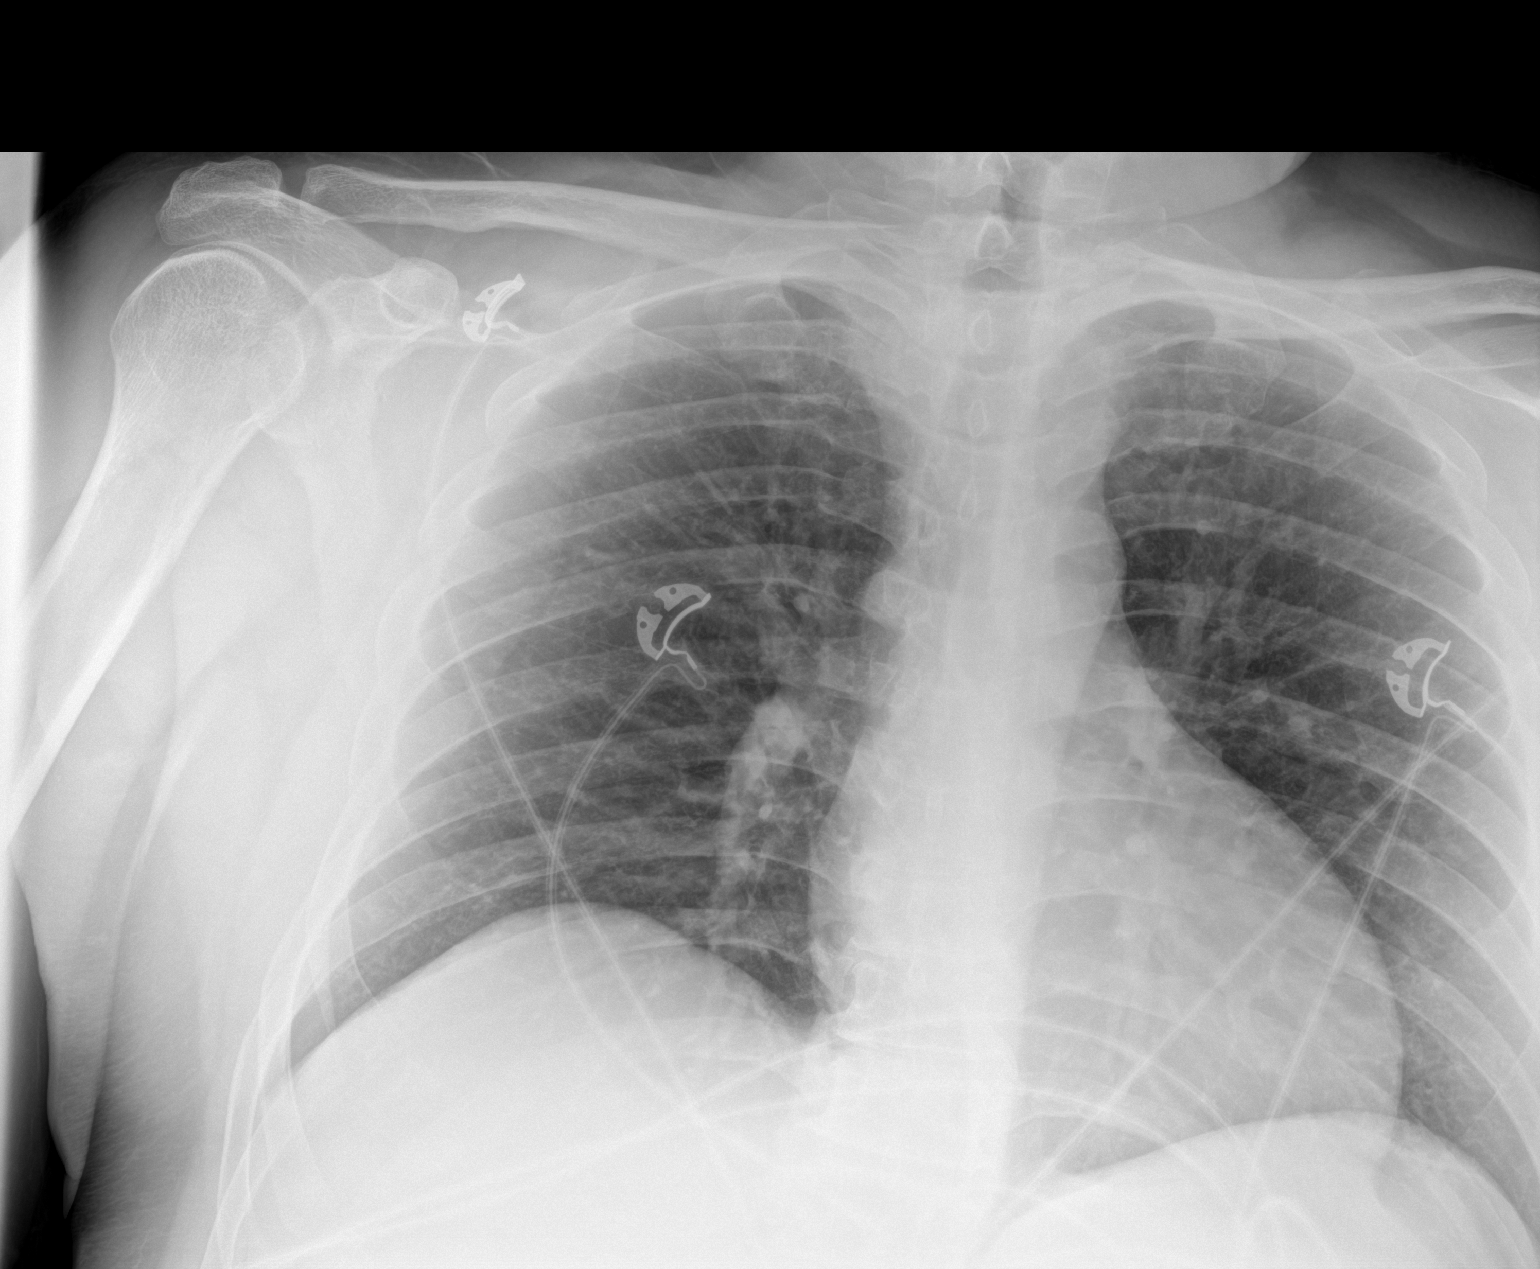
[im 2/2]
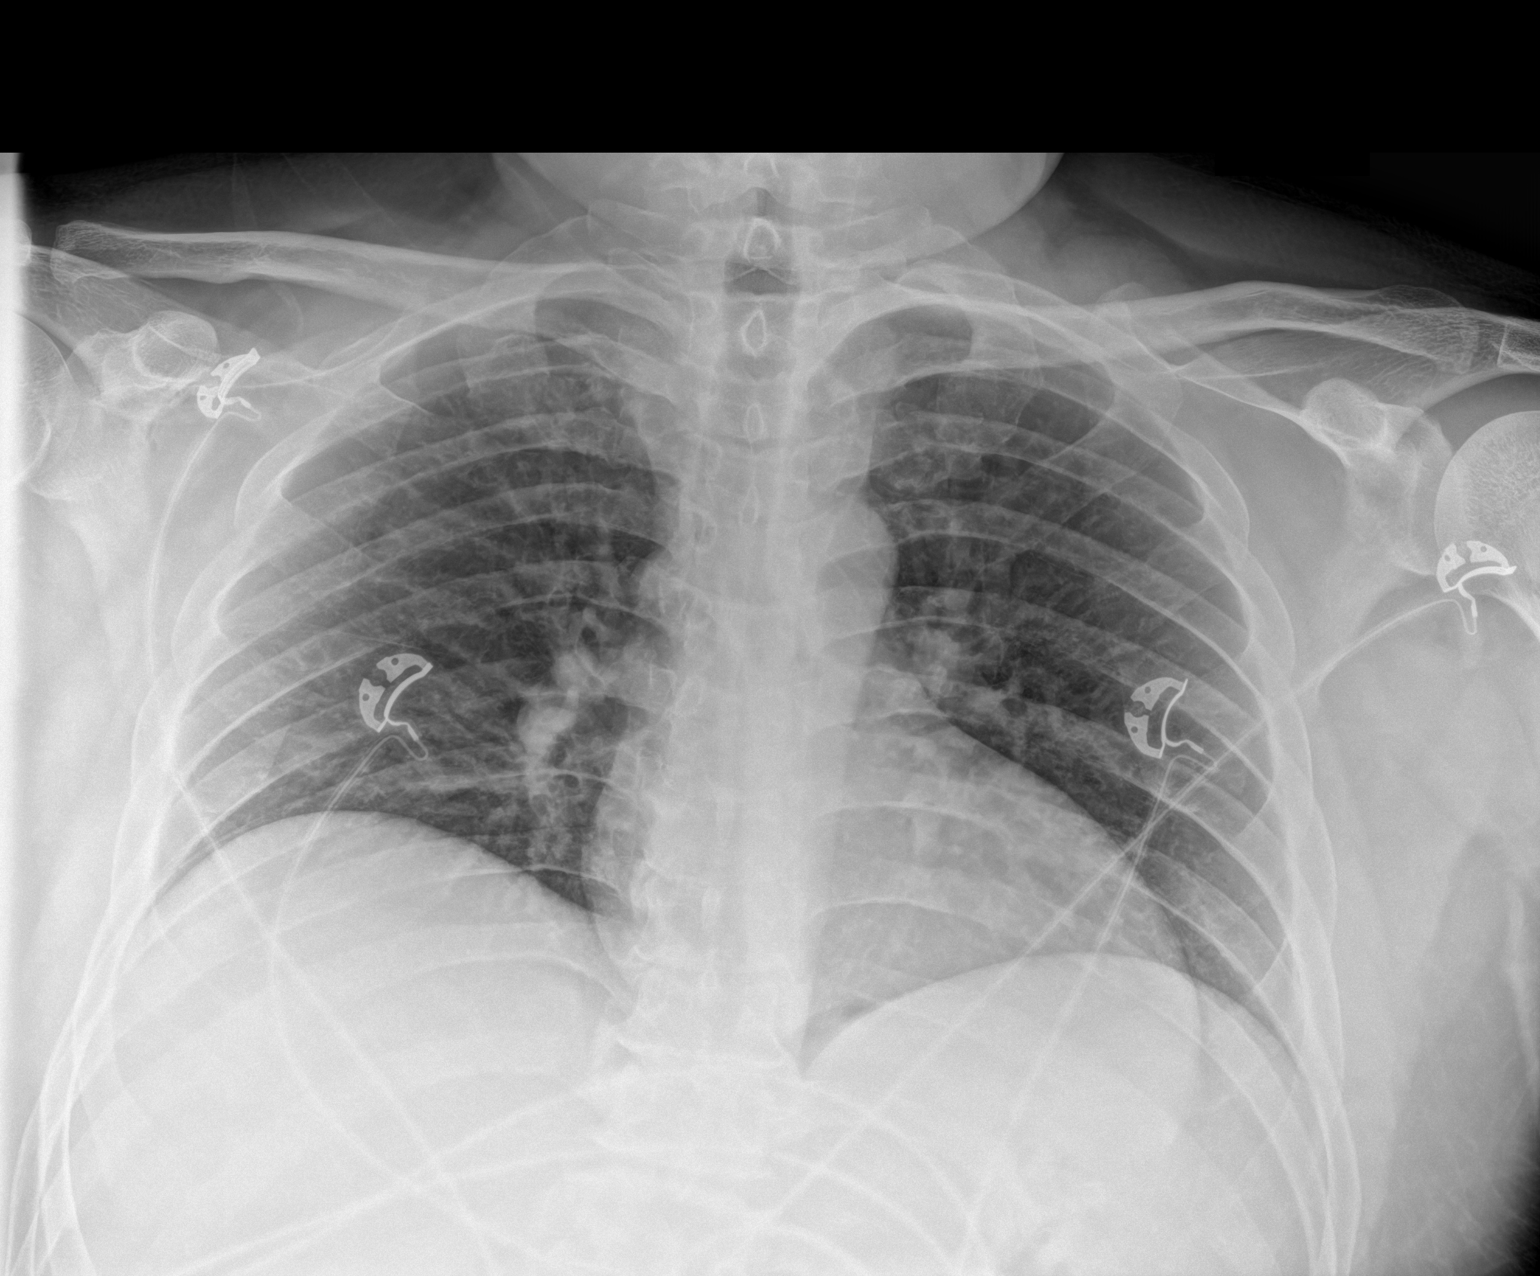

[2 of 2 positions shown; findings below may reference images not displayed]

FINDINGS: Low lung volumes.The cardiomediastinal contours are normal.
Pulmonary vasculature is normal. No consolidation, pleural effusion,
or pneumothorax. No acute osseous abnormalities are seen.
IMPRESSION: Low lung volumes without acute abnormality.

## 2021-06-15 ENCOUNTER — Other Ambulatory Visit: Payer: Self-pay

## 2021-06-15 ENCOUNTER — Ambulatory Visit (HOSPITAL_COMMUNITY)
Admission: EM | Admit: 2021-06-15 | Discharge: 2021-06-15 | Disposition: A | Discharge status: Home / Self Care | Payer: BC Managed Care – PPO | Attending: Physician Assistant | Admitting: Physician Assistant

## 2021-06-15 ENCOUNTER — Encounter (HOSPITAL_COMMUNITY): Payer: Self-pay | Admitting: *Deleted

## 2021-06-15 DIAGNOSIS — L309 Dermatitis, unspecified: Secondary | ICD-10-CM | POA: Diagnosis not present

## 2021-06-15 MED ORDER — TRIAMCINOLONE ACETONIDE 0.5 % EX OINT: 1.0000 "application " | TOPICAL_OINTMENT | Freq: Two times a day (BID) | CUTANEOUS | 0 refills | Status: AC

## 2021-06-15 NOTE — ED Triage Notes (Signed)
Pt reports a rash to Rt lower anterior leg after wearing tight socks.

## 2021-06-15 NOTE — ED Provider Notes (Signed)
[facility]    CSN: [id number] Arrival date & time: 06/15/21  1748      History   Chief Complaint Chief Complaint  Patient presents with   Rash    HPI Roberto Garza is a 45 y.o. male.   Pt reports he noticed itching.  Pt now has a red rash  The history is provided by the patient. No language interpreter was used.  Rash Location:  Leg Quality: itchiness and redness   Severity:  Moderate Onset quality:  Gradual Duration:  3 days Timing:  Constant Progression:  Worsening Relieved by:  Nothing Worsened by:  Nothing  Past Medical History:  Diagnosis Date   Diabetes mellitus without complication (HCC)     There are no problems to display for this patient.   Past Surgical History:  Procedure Laterality Date   LEG SURGERY         Home Medications    Prior to Admission medications   Medication Sig Start Date End Date Taking? Authorizing Provider  glyBURIDE (DIABETA) 5 MG tablet Take 5 mg by mouth 2 (two) times daily with a meal.   Yes [provider]  METFORMIN HCL PO Take 1,000 mg by mouth 2 (two) times a day.    Yes [provider]  triamcinolone ointment (KENALOG) 0.5 % Apply 1 application topically 2 (two) times daily. 06/15/21  Yes Elson Areas, PA-C  meloxicam (MOBIC) 15 MG tablet Take 1 tablet (15 mg total) by mouth daily. 08/05/20   Cuthriell, Delorise Royals, PA-C  methocarbamol (ROBAXIN) 500 MG tablet Take 1 tablet (500 mg total) by mouth 4 (four) times daily. 08/05/20   Cuthriell, Delorise Royals, PA-C  oxyCODONE-acetaminophen (PERCOCET/ROXICET) 5-325 MG tablet Take 1 tablet by mouth every 6 (six) hours as needed for severe pain. 08/05/20   Cuthriell, Delorise Royals, PA-C    Family History History reviewed. No pertinent family history.  Social History Social History   Tobacco Use   Smoking status: Former   Smokeless tobacco: Never  Substance Use Topics   Alcohol use: Not Currently    Comment: occasional   Drug use: No      Allergies   Patient has no known allergies.   Review of Systems Review of Systems  Skin:  Positive for rash.  All other systems reviewed and are negative.   Physical Exam Triage Vital Signs ED Triage Vitals [06/15/21 1828]  Enc Vitals Group     BP 125/79     Pulse Rate 80     Resp 16     Temp 98.2 F (36.8 C)     Temp src      SpO2 99 %     Weight      Height      Head Circumference      Peak Flow      Pain Score 0     Pain Loc      Pain Edu?      Excl. in GC?    No data found.  Updated Vital Signs BP 125/79   Pulse 80   Temp 98.2 F (36.8 C)   Resp 16   SpO2 99%   Visual Acuity Right Eye Distance:   Left Eye Distance:   Bilateral Distance:    Right Eye Near:   Left Eye Near:    Bilateral Near:     Physical Exam Constitutional:      Appearance: Normal appearance.  Musculoskeletal:  General: Normal range of motion.  Skin:    Findings: Erythema present.  Neurological:     General: No focal deficit present.     Mental Status: He is alert.  Psychiatric:        Mood and Affect: Mood normal.     UC Treatments / Results  Labs (all labs ordered are listed, but only abnormal results are displayed) Labs Reviewed - No data to display  EKG   Radiology No results found.  Procedures Procedures (including critical care time)  Medications Ordered in UC Medications - No data to display  Initial Impression / Assessment and Plan / UC Course  I have reviewed the triage vital signs and the nursing notes.  Pertinent labs & imaging results that were available during my care of the patient were reviewed by me and considered in my medical decision making (see chart for details).     MDM:  rash looks like contact.  Pt given rx for triamcinalone Final Clinical Impressions(s) / UC Diagnoses   Final diagnoses:  Dermatitis     Discharge Instructions      Recheck in 2 days if not improving   ED Prescriptions     Medication Sig  Dispense Auth. Provider   triamcinolone ointment (KENALOG) 0.5 % Apply 1 application topically 2 (two) times daily. 30 g Elson Areas, [place]      PDMP not reviewed this encounter. An After Visit Summary was printed and given to the patient.    Elson Areas, [place] 06/15/21 1849

## 2021-06-15 NOTE — Discharge Instructions (Addendum)
Recheck in 2 days if not improving °

## 2022-01-26 IMAGING — CR DG LUMBAR SPINE 2-3V
1 series · 3 of 3 positions shown · non-contrast
Comparison: None.

CLINICAL DATA: cinder block fell on back, low back pain

EXAM:
LUMBAR SPINE - 2-3 VIEW

[Series 1: dg lumbar spine 2-3 views · 0.14mm/px · 3 of 3 slices shown]
[im 1/3]
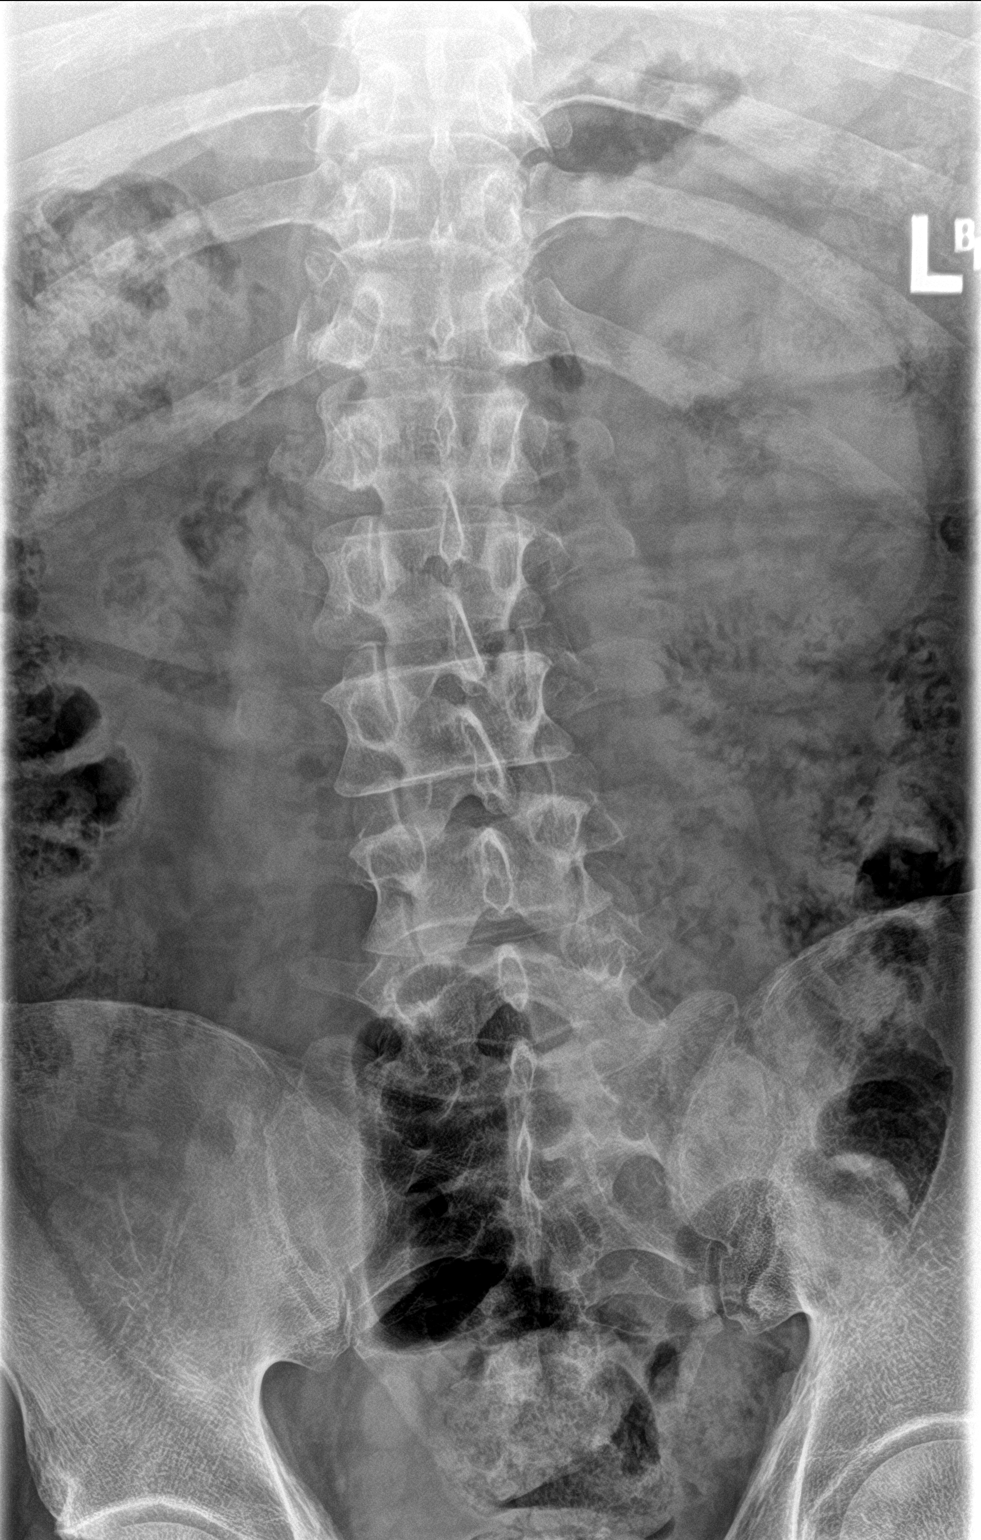
[im 2/3]
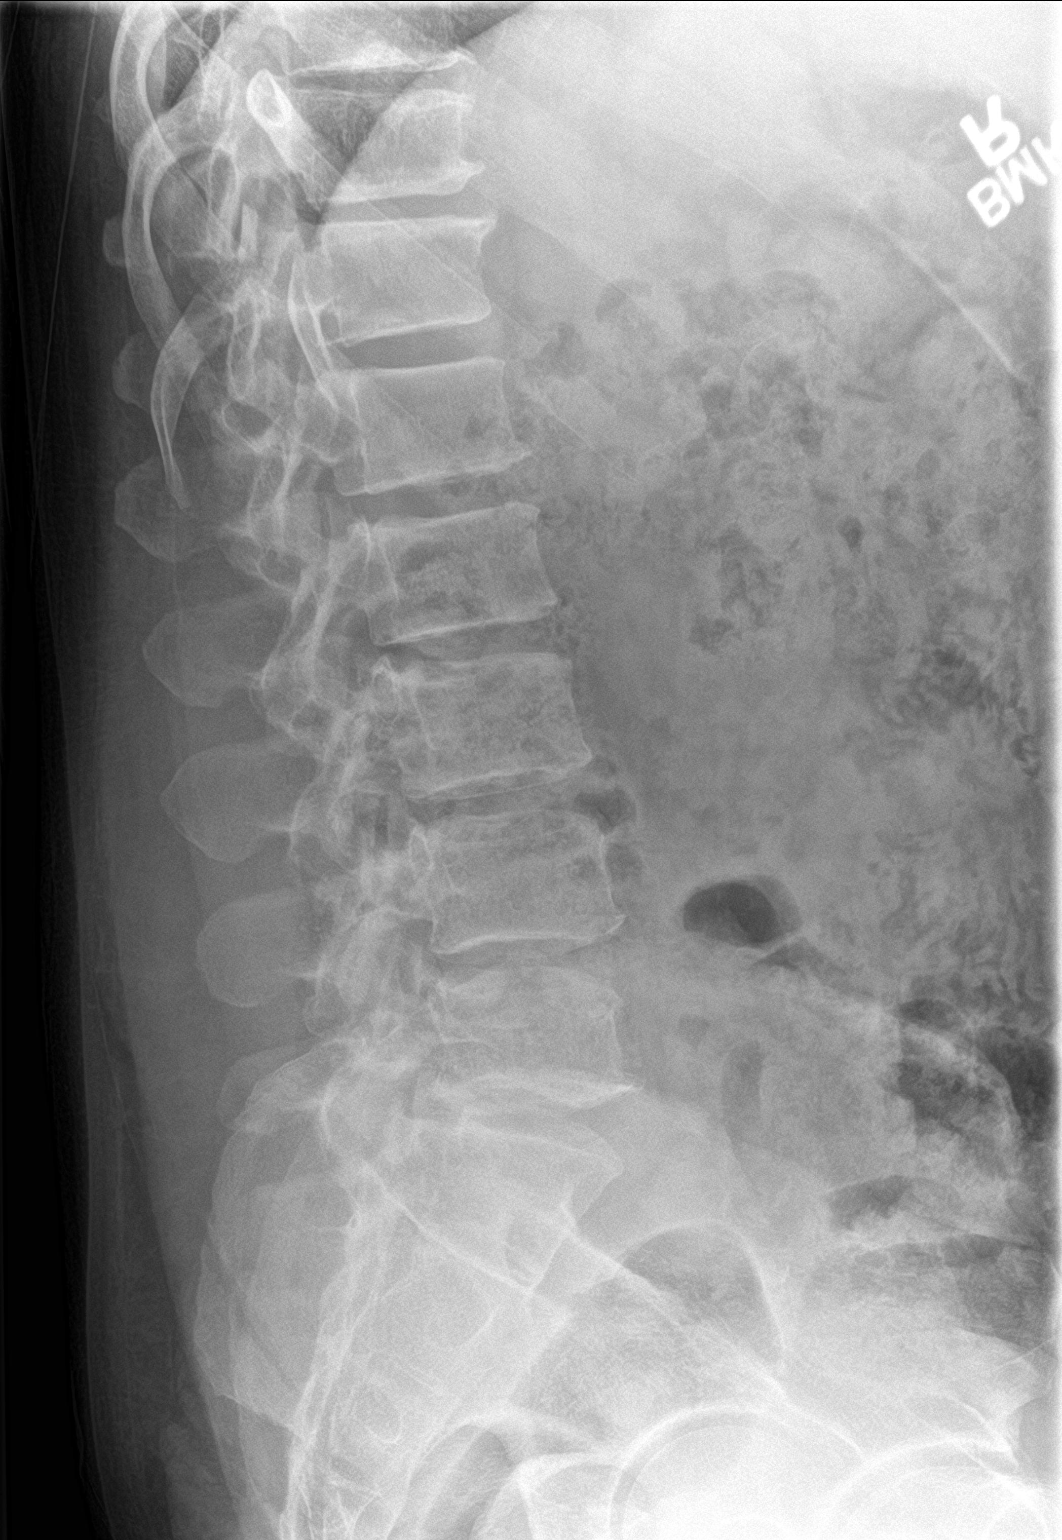
[im 3/3]
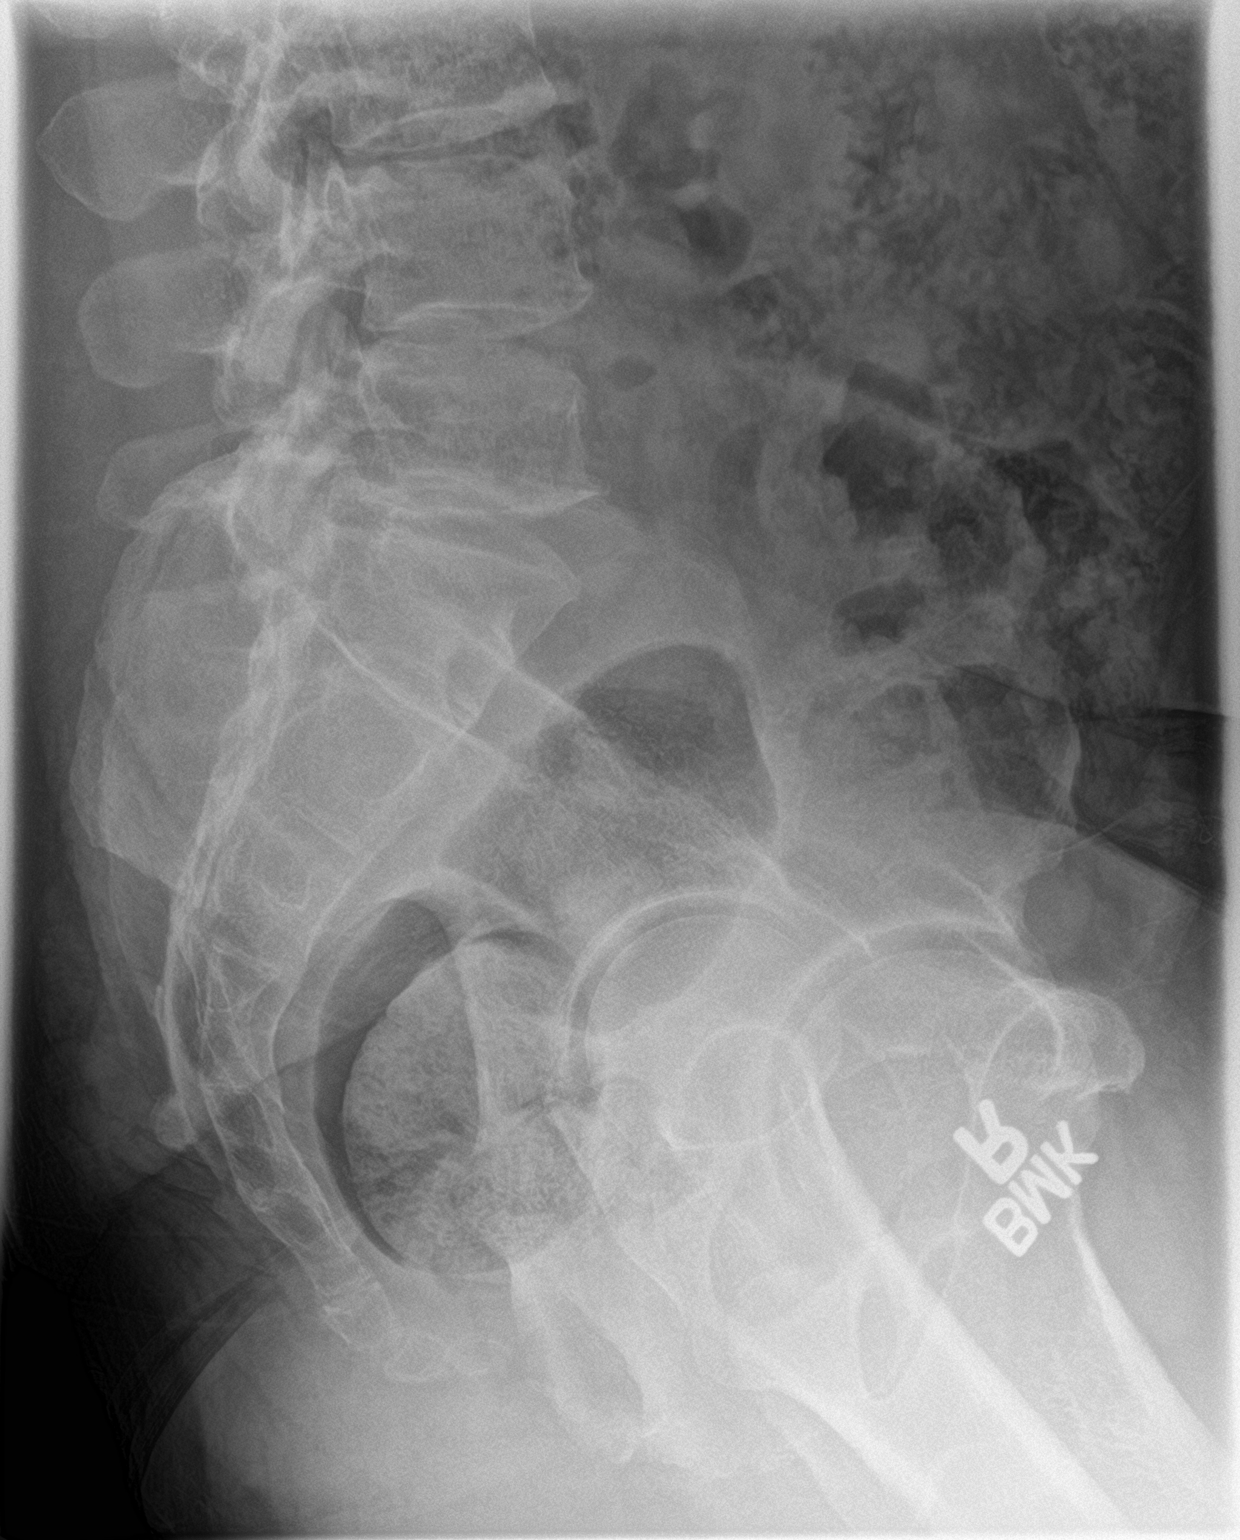

[3 of 3 positions shown; findings below may reference images not displayed]

FINDINGS: Straightening of normal lordosis. Mild broad-based rightward
curvature. No acute fracture. The vertebral body heights are normal.
The posterior elements are intact. Mild endplate spurring at L1-L2
and L5-S1 with preservation of disc spaces. The sacroiliac joints
are congruent.
IMPRESSION: 1. No acute fracture or subluxation of the lumbar spine.
2. Mild degenerative endplate spurring.

## 2023-04-25 ENCOUNTER — Ambulatory Visit
Admission: EM | Admit: 2023-04-25 | Discharge: 2023-04-25 | Disposition: A | Discharge status: Home / Self Care | Payer: BC Managed Care – PPO | Attending: Family | Admitting: Family

## 2023-04-25 DIAGNOSIS — J069 Acute upper respiratory infection, unspecified: Secondary | ICD-10-CM | POA: Diagnosis present

## 2023-04-25 LAB — GROUP A STREP BY PCR: Group A Strep by PCR: NOT DETECTED

## 2023-04-25 MED ORDER — BENZONATATE 200 MG PO CAPS: 200.0000 mg | ORAL_CAPSULE | Freq: Three times a day (TID) | ORAL | 0 refills | Status: AC | PRN

## 2023-04-25 NOTE — ED Provider Notes (Signed)
[facility]    CSN: [id number] Arrival date & time: 04/25/23  1149      History   Chief Complaint Chief Complaint  Patient presents with   Cough   Fever    HPI Roberto Garza is a 47 y.o. male who presents to urgent care this Afternoon with complaints of cough for the past 2 weeks.  About 2 weeks ago he was he started having intermittent dry cough, runny nose, and itchy throat.  He did a COVID test that was negative and he quarantined himself for a few days.  All other symptoms resolved except intermittent dry cough and scratchy throat.  Patient denies fever, chills, ear pain, runny nose, headache, chest pain, or shortness of breath.  Reports history of bronchitis in the past and was supposed to be taking metformin for diabetes but has not been taking the medication, is waiting to go see his primary care provider soon.  Past Medical History:  Diagnosis Date   Diabetes mellitus without complication (HCC)     There are no problems to display for this patient.   Past Surgical History:  Procedure Laterality Date   LEG SURGERY       Home Medications    Prior to Admission medications   Medication Sig Start Date End Date Taking? Authorizing Provider  benzonatate (TESSALON) 200 MG capsule Take 1 capsule (200 mg total) by mouth 3 (three) times daily as needed for up to 7 days for cough. 04/25/23 05/02/23 Yes Eleonore Chiquito, FNP  glyBURIDE (DIABETA) 5 MG tablet Take 5 mg by mouth 2 (two) times daily with a meal.    [provider]  meloxicam (MOBIC) 15 MG tablet Take 1 tablet (15 mg total) by mouth daily. 08/05/20   Cuthriell, Delorise Royals, PA-C  METFORMIN HCL PO Take 1,000 mg by mouth 2 (two) times a day.     [provider]  methocarbamol (ROBAXIN) 500 MG tablet Take 1 tablet (500 mg total) by mouth 4 (four) times daily. 08/05/20   Cuthriell, Delorise Royals, PA-C  oxyCODONE-acetaminophen (PERCOCET/ROXICET) 5-325 MG tablet Take 1 tablet by mouth every 6  (six) hours as needed for severe pain. 08/05/20   Cuthriell, Delorise Royals, PA-C  triamcinolone ointment (KENALOG) 0.5 % Apply 1 application topically 2 (two) times daily. 06/15/21   Elson Areas, PA-C    Family History History reviewed. No pertinent family history.  Social History Social History   Tobacco Use   Smoking status: Former   Smokeless tobacco: Never  Substance Use Topics   Alcohol use: Not Currently    Comment: occasional   Drug use: No    Allergies   Patient has no known allergies.   Review of Systems Review of Systems  Constitutional:  Positive for fever.  HENT: Negative.    Eyes: Negative.   Respiratory:  Positive for cough.   Cardiovascular: Negative.   Endocrine: Negative.   Genitourinary: Negative.    Physical Exam Triage Vital Signs ED Triage Vitals [04/25/23 1217]  Enc Vitals Group     BP (!) 125/91     Pulse Rate 86     Resp 19     Temp 98.7 F (37.1 C)     Temp Source Oral     SpO2 97 %     Weight      Height      Head Circumference      Peak Flow      Pain Score  Pain Loc      Pain Edu?      Excl. in GC?    No data found.  Updated Vital Signs BP (!) 125/91 (BP Location: Left Arm)   Pulse 86   Temp 98.7 F (37.1 C) (Oral)   Resp 19   SpO2 97%   Visual Acuity Right Eye Distance:   Left Eye Distance:   Bilateral Distance:    Right Eye Near:   Left Eye Near:    Bilateral Near:     Physical Exam Constitutional:      Appearance: Normal appearance. He is normal weight.  HENT:     Head: Normocephalic and atraumatic.     Right Ear: Tympanic membrane, ear canal and external ear normal.     Left Ear: Tympanic membrane, ear canal and external ear normal.     Nose: Nose normal.     Mouth/Throat:     Mouth: Mucous membranes are moist.  Eyes:     Pupils: Pupils are equal, round, and reactive to light.  Cardiovascular:     Rate and Rhythm: Normal rate and regular rhythm.  Pulmonary:     Effort: Pulmonary effort is normal.      Breath sounds: Normal breath sounds.  Abdominal:     General: Bowel sounds are normal.  Musculoskeletal:     Cervical back: Normal range of motion.  Neurological:     Mental Status: He is alert and oriented to person, place, and time.  Psychiatric:        Behavior: Behavior normal.      UC Treatments / Results  Labs (all labs ordered are listed, but only abnormal results are displayed) Labs Reviewed  GROUP A STREP BY PCR    EKG   Radiology No results found.  Procedures Procedures (including critical care time)  Medications Ordered in UC Medications - No data to display  Initial Impression / Assessment and Plan / UC Course  I have reviewed the triage vital signs and the nursing notes.  Pertinent labs & imaging results that were available during my care of the patient were reviewed by me and considered in my medical decision making (see chart for details).    Patient is a 46 year old male who is seen this afternoon with complaints of dry cough for almost 2 weeks.  He had developed other symptoms including runny nose, fever and chills, resolved.  Continues to have dry cough.  Patient diagnosed with upper respiratory viral infection with cough.  PCR COVID test done, it is negative.  He is prescribed Tessalon Perles 200 mg by mouth 3 times daily as needed for a total of 7 days for cough.  Reminded to take Tylenol or ibuprofen as needed and stay hydrated. Final Clinical Impressions(s) / UC Diagnoses   Final diagnoses:  Viral upper respiratory tract infection with cough     Discharge Instructions      1) take benzonatate 200 mg by mouth 3 times a day for 7 days. 2) take Tylenol or ibuprofen as needed for pain or fever. 3) stay hydrated and have enough rest.   ED Prescriptions     Medication Sig Dispense Auth. Provider   benzonatate (TESSALON) 200 MG capsule Take 1 capsule (200 mg total) by mouth 3 (three) times daily as needed for up to 7 days for cough. 21  capsule Eleonore Chiquito, FNP      PDMP not reviewed this encounter.   Eleonore Chiquito, FNP 04/25/23 1302

## 2023-04-25 NOTE — Discharge Instructions (Addendum)
1) take benzonatate 200 mg by mouth 3 times a day for 7 days. 2) take Tylenol or ibuprofen as needed for pain or fever. 3) stay hydrated and have enough rest. 4) reports new or worsening symptoms to your primary care provider or local emergency room.

## 2023-04-25 NOTE — ED Triage Notes (Signed)
Pt states that he had a cold a few weeks ago. Feels like he's running a fever. Coughing. Sniffles, throat itching. Hasn't taken anything for this.

## 2023-12-23 ENCOUNTER — Emergency Department
Admission: EM | Admit: 2023-12-23 | Discharge: 2023-12-23 | Discharge status: Against Medical Advice | Payer: BC Managed Care – PPO | Source: Home / Self Care

## 2023-12-24 ENCOUNTER — Emergency Department
Admission: EM | Admit: 2023-12-24 | Discharge: 2023-12-24 | Disposition: A | Discharge status: Home / Self Care | Payer: BC Managed Care – PPO | Attending: Emergency Medicine | Admitting: Emergency Medicine

## 2023-12-24 ENCOUNTER — Emergency Department: Payer: BC Managed Care – PPO

## 2023-12-24 ENCOUNTER — Other Ambulatory Visit: Payer: Self-pay

## 2023-12-24 DIAGNOSIS — R0781 Pleurodynia: Secondary | ICD-10-CM | POA: Diagnosis present

## 2023-12-24 DIAGNOSIS — Y9269 Other specified industrial and construction area as the place of occurrence of the external cause: Secondary | ICD-10-CM | POA: Insufficient documentation

## 2023-12-24 DIAGNOSIS — S299XXA Unspecified injury of thorax, initial encounter: Secondary | ICD-10-CM | POA: Insufficient documentation

## 2023-12-24 DIAGNOSIS — E119 Type 2 diabetes mellitus without complications: Secondary | ICD-10-CM | POA: Insufficient documentation

## 2023-12-24 DIAGNOSIS — Y990 Civilian activity done for income or pay: Secondary | ICD-10-CM | POA: Insufficient documentation

## 2023-12-24 MED ORDER — IBUPROFEN 400 MG PO TABS
400.0000 mg | ORAL_TABLET | Freq: Once | ORAL | Status: AC
  Administered 2023-12-24: 400 mg via ORAL
  Filled 2023-12-24: qty 1

## 2023-12-24 MED ORDER — LIDOCAINE 5 % EX PTCH: 1.0000 | MEDICATED_PATCH | Freq: Two times a day (BID) | CUTANEOUS | 0 refills | Status: AC

## 2023-12-24 NOTE — ED Triage Notes (Signed)
Pt sts that he has pain with palpation on the left side of his chest. Pt sts that a coworker came up and give him a big hug and lifted him up also. Pt sts during that time he felt a pop in his chest.
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
